# Patient Record
Sex: Male | Born: 1970 | Race: White | Hispanic: No | Marital: Married | Smoking: Former smoker
Health system: Southern US, Community
[De-identification: ages and names within clinical notes are randomized; demographics above are authoritative.]

## PROBLEM LIST (undated history)

## (undated) DIAGNOSIS — F32A Depression, unspecified: Secondary | ICD-10-CM

## (undated) DIAGNOSIS — F419 Anxiety disorder, unspecified: Secondary | ICD-10-CM

## (undated) DIAGNOSIS — N200 Calculus of kidney: Secondary | ICD-10-CM

## (undated) DIAGNOSIS — T7840XA Allergy, unspecified, initial encounter: Secondary | ICD-10-CM

## (undated) DIAGNOSIS — F329 Major depressive disorder, single episode, unspecified: Secondary | ICD-10-CM

## (undated) DIAGNOSIS — K219 Gastro-esophageal reflux disease without esophagitis: Secondary | ICD-10-CM

## (undated) DIAGNOSIS — I10 Essential (primary) hypertension: Secondary | ICD-10-CM

## (undated) HISTORY — DX: Gastro-esophageal reflux disease without esophagitis: K21.9

## (undated) HISTORY — DX: Allergy, unspecified, initial encounter: T78.40XA

## (undated) HISTORY — DX: Depression, unspecified: F32.A

## (undated) HISTORY — DX: Anxiety disorder, unspecified: F41.9

## (undated) HISTORY — DX: Essential (primary) hypertension: I10

## (undated) HISTORY — DX: Calculus of kidney: N20.0

## (undated) HISTORY — PX: KNEE ARTHROSCOPY W/ ACL RECONSTRUCTION: SHX1858

## (undated) HISTORY — PX: LITHOTRIPSY: SUR834

## (undated) HISTORY — DX: Major depressive disorder, single episode, unspecified: F32.9

## (undated) HISTORY — PX: OTHER SURGICAL HISTORY: SHX169

---

## 2000-07-03 ENCOUNTER — Emergency Department (HOSPITAL_COMMUNITY): Admission: EM | Admit: 2000-07-03 | Discharge: 2000-07-03 | Payer: Self-pay | Admitting: Emergency Medicine

## 2000-07-03 ENCOUNTER — Encounter: Payer: Self-pay | Admitting: Emergency Medicine

## 2000-08-04 ENCOUNTER — Ambulatory Visit (HOSPITAL_COMMUNITY): Admission: RE | Admit: 2000-08-04 | Discharge: 2000-08-04 | Payer: Self-pay | Admitting: Orthopedic Surgery

## 2000-08-04 ENCOUNTER — Encounter: Payer: Self-pay | Admitting: Orthopedic Surgery

## 2005-03-25 ENCOUNTER — Emergency Department (HOSPITAL_COMMUNITY): Admission: EM | Admit: 2005-03-25 | Discharge: 2005-03-25 | Payer: Self-pay | Admitting: Emergency Medicine

## 2006-11-30 ENCOUNTER — Emergency Department (HOSPITAL_COMMUNITY): Admission: EM | Admit: 2006-11-30 | Discharge: 2006-11-30 | Payer: Self-pay | Admitting: Emergency Medicine

## 2007-10-22 ENCOUNTER — Ambulatory Visit: Admission: RE | Admit: 2007-10-22 | Discharge: 2007-10-22 | Payer: Self-pay | Admitting: Pulmonary Disease

## 2008-11-25 ENCOUNTER — Emergency Department: Payer: Self-pay | Admitting: Emergency Medicine

## 2009-06-01 ENCOUNTER — Inpatient Hospital Stay (HOSPITAL_COMMUNITY): Admission: EM | Admit: 2009-06-01 | Discharge: 2009-06-04 | Payer: Self-pay | Admitting: Emergency Medicine

## 2009-06-21 ENCOUNTER — Encounter (HOSPITAL_COMMUNITY): Admission: RE | Admit: 2009-06-21 | Discharge: 2009-07-21 | Payer: Self-pay | Admitting: *Deleted

## 2010-03-28 ENCOUNTER — Ambulatory Visit (HOSPITAL_COMMUNITY): Admission: RE | Admit: 2010-03-28 | Discharge: 2010-03-28 | Payer: Self-pay | Admitting: Family Medicine

## 2010-10-26 ENCOUNTER — Ambulatory Visit (HOSPITAL_COMMUNITY)
Admission: RE | Admit: 2010-10-26 | Discharge: 2010-10-26 | Disposition: A | Discharge status: Home / Self Care | Payer: 59 | Source: Ambulatory Visit | Attending: Internal Medicine | Admitting: Internal Medicine

## 2010-10-26 ENCOUNTER — Encounter (HOSPITAL_BASED_OUTPATIENT_CLINIC_OR_DEPARTMENT_OTHER): Payer: 59 | Admitting: Internal Medicine

## 2010-10-26 DIAGNOSIS — R197 Diarrhea, unspecified: Secondary | ICD-10-CM | POA: Insufficient documentation

## 2010-10-26 DIAGNOSIS — K644 Residual hemorrhoidal skin tags: Secondary | ICD-10-CM

## 2010-10-26 DIAGNOSIS — R198 Other specified symptoms and signs involving the digestive system and abdomen: Secondary | ICD-10-CM

## 2010-10-26 DIAGNOSIS — I10 Essential (primary) hypertension: Secondary | ICD-10-CM | POA: Insufficient documentation

## 2010-10-26 DIAGNOSIS — R1032 Left lower quadrant pain: Secondary | ICD-10-CM | POA: Insufficient documentation

## 2010-10-26 DIAGNOSIS — K921 Melena: Secondary | ICD-10-CM

## 2010-10-26 DIAGNOSIS — K589 Irritable bowel syndrome without diarrhea: Secondary | ICD-10-CM

## 2010-10-26 DIAGNOSIS — Z79899 Other long term (current) drug therapy: Secondary | ICD-10-CM | POA: Insufficient documentation

## 2010-11-19 NOTE — Op Note (Signed)
  NAMECARLIE, CORPUS              ACCOUNT NO.:  [id number]  MEDICAL RECORD NO.:  [id number]           PATIENT TYPE:  O  LOCATION:  DAYP                          FACILITY:  [facility]  PHYSICIAN:  Lionel December, M.D.    DATE OF BIRTH:  1971-06-15  DATE OF PROCEDURE:  10/26/2010 DATE OF DISCHARGE:  10/26/2010                              OPERATIVE REPORT   PROCEDURE:  Colonoscopy with terminal ileoscopy.  INDICATIONS:  Fard is a 40 year old Caucasian male who has had intermittent diarrhea for the last 3 years and lately he has had hematochezia.  With his episodes of diarrhea at times, he has been constipated.  Earlier this week, he woke up with excruciating pain in his left side of his abdomen and also had diarrhea.  He finally decided to be evaluated.  He is undergoing diagnostic colonoscopy.  Procedure risks were reviewed with the patient and informed consent was obtained.  MEDICATIONS FOR CONSCIOUS SEDATION: 1. Demerol 50 mg IV. 2. Versed 4 mg IV.  FINDINGS:  Procedure performed in endoscopy suite.  The patient's vital signs and O2 sats were monitored during the procedure and remained stable.  The patient was placed in left lateral recumbent position and rectal examination performed.  No abnormality noted on external or digital exam.  Pentax videoscope was placed through rectum and advanced under vision into sigmoid colon and beyond.  Preparation was excellent. The scope was passed into cecum which was identified by ileocecal valve and appendiceal orifice.  Pictures were taken for the record.  Short segment of GI was also examined and was normal.  Colonic mucosa was carefully examined on the way out and no mucosal abnormalities or diverticular changes were noted.  Rectal mucosa similarly was normal. Scope was retroflexed to examine anorectal junction and small hemorrhoids noted below the dentate line.  Endoscope was straightened and withdrawn.  Withdrawal time was over 70  minutes.  The patient tolerated the procedure well.  FINAL DIAGNOSIS: 1. Normal terminal ileum. 2. Normal colonoscopy except small external hemorrhoids. 3. I suspect his symptoms are secondary to irritable bowel syndrome.  RECOMMENDATIONS: 1. High-fiber diet plus fiber supplement 3-4 g daily. 2. Dicyclomine 10 mg once or twice daily.  Prescription was called in     2 days ago. 3. If symptoms persist, he will call.  He will return for OV in 3     months.     Lionel December, M.D.     NR/MEDQ  D:  10/26/2010  T:  10/27/2010  Job:  [id number]  cc:   Francoise Schaumann. Raynelle Highland Fax: [phone]  Electronically Signed by Lionel December M.D. on 11/19/2010 02:20:16 PM

## 2010-12-08 LAB — CBC
MCV: 89.1 fL (ref 78.0–100.0)
RBC: 4.46 MIL/uL (ref 4.22–5.81)
WBC: 7.8 10*3/uL (ref 4.0–10.5)

## 2010-12-08 LAB — DIFFERENTIAL
Eosinophils Absolute: 0.1 10*3/uL (ref 0.0–0.7)
Lymphocytes Relative: 14 % (ref 12–46)
Lymphs Abs: 1.1 10*3/uL (ref 0.7–4.0)
Monocytes Relative: 8 % (ref 3–12)
Neutro Abs: 6 10*3/uL (ref 1.7–7.7)
Neutrophils Relative %: 77 % (ref 43–77)

## 2010-12-09 LAB — CBC
HCT: 41.2 % (ref 39.0–52.0)
HCT: 45.8 % (ref 39.0–52.0)
Hemoglobin: 15.3 g/dL (ref 13.0–17.0)
MCHC: 33.4 g/dL (ref 30.0–36.0)
MCHC: 33.8 g/dL (ref 30.0–36.0)
MCV: 89.2 fL (ref 78.0–100.0)
Platelets: 175 10*3/uL (ref 150–400)
RBC: 5.13 MIL/uL (ref 4.22–5.81)
RDW: 13.3 % (ref 11.5–15.5)

## 2010-12-09 LAB — POCT I-STAT, CHEM 8
BUN: 14 mg/dL (ref 6–23)
Creatinine, Ser: 1.3 mg/dL (ref 0.4–1.5)
Glucose, Bld: 126 mg/dL — ABNORMAL HIGH (ref 70–99)
Hemoglobin: 16.3 g/dL (ref 13.0–17.0)
TCO2: 24 mmol/L (ref 0–100)

## 2010-12-09 LAB — BASIC METABOLIC PANEL
BUN: 10 mg/dL (ref 6–23)
CO2: 26 mEq/L (ref 19–32)
Calcium: 8.5 mg/dL (ref 8.4–10.5)
GFR calc non Af Amer: 57 mL/min — ABNORMAL LOW (ref >60)
Glucose, Bld: 105 mg/dL — ABNORMAL HIGH (ref 70–99)

## 2011-01-17 NOTE — Procedures (Signed)
NAME:  Alexander Osborn, Alexander Osborn              ACCOUNT NO.:  [id number]   MEDICAL RECORD NO.:  [id number]          PATIENT TYPE:  OUT   LOCATION:  SLEE                          FACILITY:  [facility]   PHYSICIAN:  Kofi A. Gerilyn Pilgrim, M.D. DATE OF BIRTH:  1971/05/18   DATE OF PROCEDURE:  10/22/2007  DATE OF DISCHARGE:  10/22/2007                             SLEEP DISORDER REPORT   POLYSOMNOGRAPHY REPORT:   INDICATION FOR THE STUDY:  Snoring, daytime fatigue, being ruled out for  obstructive sleep apnea.  BMI 26.   EPWORTH SLEEPINESS SCALE:  11.   MEDICATIONS:  Androgen.   SLEEP SUMMARY:  The total recording time 401 minutes.  Sleep efficiency  80.2%.  Sleep latency 58.1 minutes.  REM latency 131 minutes.  Stage N1  3%, N2 68.5%, N3 18.6%, and stage REM 9.9%.   RESPIRATORY SUMMARY:  Baseline oxygen saturation 94% with lowest  desaturation 84%.  AHI index 0.2.   LIMB MOVEMENT SUMMARY:  PLM  index is 0.7.   ELECTROCARDIOGRAM SUMMARY:  Average heart rate 74.   IMPRESSION:  This is an unremarkable recording.   Thanks for this referral.      Kofi A. Gerilyn Pilgrim, M.D.  Electronically Signed     KAD/MEDQ  D:  10/28/2007  T:  10/28/2007  Job:  [id number]

## 2011-01-23 ENCOUNTER — Other Ambulatory Visit: Payer: Self-pay | Admitting: Orthopedic Surgery

## 2011-01-23 DIAGNOSIS — M25512 Pain in left shoulder: Secondary | ICD-10-CM

## 2011-01-24 ENCOUNTER — Ambulatory Visit
Admission: RE | Admit: 2011-01-24 | Discharge: 2011-01-24 | Disposition: A | Discharge status: Home / Self Care | Payer: 59 | Source: Ambulatory Visit | Attending: Orthopedic Surgery | Admitting: Orthopedic Surgery

## 2011-01-24 DIAGNOSIS — M25512 Pain in left shoulder: Secondary | ICD-10-CM

## 2011-02-06 ENCOUNTER — Ambulatory Visit (INDEPENDENT_AMBULATORY_CARE_PROVIDER_SITE_OTHER): Payer: 59 | Admitting: Internal Medicine

## 2011-08-28 ENCOUNTER — Emergency Department (HOSPITAL_COMMUNITY): Payer: BC Managed Care – PPO

## 2011-08-28 ENCOUNTER — Emergency Department (HOSPITAL_COMMUNITY)
Admission: EM | Admit: 2011-08-28 | Discharge: 2011-08-28 | Disposition: A | Discharge status: Home / Self Care | Payer: BC Managed Care – PPO | Attending: Emergency Medicine | Admitting: Emergency Medicine

## 2011-08-28 ENCOUNTER — Encounter (HOSPITAL_COMMUNITY): Payer: Self-pay | Admitting: *Deleted

## 2011-08-28 DIAGNOSIS — R109 Unspecified abdominal pain: Secondary | ICD-10-CM | POA: Insufficient documentation

## 2011-08-28 DIAGNOSIS — F17200 Nicotine dependence, unspecified, uncomplicated: Secondary | ICD-10-CM | POA: Insufficient documentation

## 2011-08-28 DIAGNOSIS — N201 Calculus of ureter: Secondary | ICD-10-CM | POA: Insufficient documentation

## 2011-08-28 DIAGNOSIS — R1032 Left lower quadrant pain: Secondary | ICD-10-CM | POA: Insufficient documentation

## 2011-08-28 LAB — CBC
Hemoglobin: 14.6 g/dL (ref 13.0–17.0)
MCH: 29.7 pg (ref 26.0–34.0)
MCHC: 33.7 g/dL (ref 30.0–36.0)
Platelets: 183 10*3/uL (ref 150–400)
RBC: 4.91 MIL/uL (ref 4.22–5.81)

## 2011-08-28 LAB — BASIC METABOLIC PANEL
BUN: 21 mg/dL (ref 6–23)
Chloride: 103 mEq/L (ref 96–112)
GFR calc Af Amer: 53 mL/min — ABNORMAL LOW (ref >90)
GFR calc non Af Amer: 45 mL/min — ABNORMAL LOW (ref >90)
Potassium: 3.7 mEq/L (ref 3.5–5.1)
Sodium: 138 mEq/L (ref 135–145)

## 2011-08-28 LAB — URINALYSIS, ROUTINE W REFLEX MICROSCOPIC
Leukocytes, UA: NEGATIVE
Nitrite: NEGATIVE
Specific Gravity, Urine: >1.03 — ABNORMAL HIGH (ref 1.005–1.030)
Urobilinogen, UA: 0.2 mg/dL (ref 0.0–1.0)
pH: 6 (ref 5.0–8.0)

## 2011-08-28 LAB — DIFFERENTIAL
Basophils Relative: 0 % (ref 0–1)
Eosinophils Absolute: 0.1 10*3/uL (ref 0.0–0.7)
Monocytes Relative: 7 % (ref 3–12)
Neutro Abs: 7.9 10*3/uL — ABNORMAL HIGH (ref 1.7–7.7)
Neutrophils Relative %: 77 % (ref 43–77)

## 2011-08-28 LAB — URINE MICROSCOPIC-ADD ON

## 2011-08-28 MED ORDER — ONDANSETRON HCL 4 MG/2ML IJ SOLN
4.0000 mg | Freq: Once | INTRAMUSCULAR | Status: AC
  Administered 2011-08-28: 4 mg via INTRAVENOUS
  Filled 2011-08-28: qty 2

## 2011-08-28 MED ORDER — KETOROLAC TROMETHAMINE 30 MG/ML IJ SOLN
INTRAMUSCULAR | Status: AC
  Filled 2011-08-28: qty 1

## 2011-08-28 MED ORDER — SODIUM CHLORIDE 0.9 % IV SOLN
Freq: Once | INTRAVENOUS | Status: AC
  Administered 2011-08-28: 08:00:00 via INTRAVENOUS

## 2011-08-28 MED ORDER — HYDROCODONE-ACETAMINOPHEN 5-325 MG PO TABS: 1.0000 | ORAL_TABLET | Freq: Four times a day (QID) | ORAL | Status: AC | PRN

## 2011-08-28 MED ORDER — TAMSULOSIN HCL 0.4 MG PO CAPS: 0.4000 mg | ORAL_CAPSULE | Freq: Every day | ORAL | Status: DC

## 2011-08-28 MED ORDER — HYDROMORPHONE HCL PF 1 MG/ML IJ SOLN
1.0000 mg | Freq: Once | INTRAMUSCULAR | Status: AC
  Administered 2011-08-28: 1 mg via INTRAVENOUS
  Filled 2011-08-28: qty 1

## 2011-08-28 MED ORDER — PROMETHAZINE HCL 25 MG PO TABS: 25.0000 mg | ORAL_TABLET | Freq: Four times a day (QID) | ORAL | Status: AC | PRN

## 2011-08-28 MED ORDER — KETOROLAC TROMETHAMINE 30 MG/ML IJ SOLN
30.0000 mg | Freq: Once | INTRAMUSCULAR | Status: AC
  Administered 2011-08-28: 30 mg via INTRAVENOUS

## 2011-08-28 MED ORDER — NAPROXEN 500 MG PO TABS: 500.0000 mg | ORAL_TABLET | Freq: Two times a day (BID) | ORAL | Status: AC

## 2011-08-28 NOTE — ED Notes (Signed)
Pt c/o left flank pain since 3 am. Pt also c/o nausea. Pt states that he had the pain night before last and then it went away until 3 am. Denies fever, vomiting or diarrhea.

## 2011-08-28 NOTE — ED Provider Notes (Signed)
History   This chart was scribed for Alexander Jakes, MD by [name]. The patient was seen in room [facility] and the patient's care was started at 8:08AM.   CSN: [id number]  Arrival date & time 08/28/11  0650   First MD Initiated Contact with Patient 08/28/11 [phone]      Chief Complaint  Patient presents with  . Flank Pain    (Consider location/radiation/quality/duration/timing/severity/associated sxs/prior treatment) HPI BENJAMEN Osborn is a 40 y.o. male who presents to the Emergency Department complaining of waxing and waning moderate to severe left flank and LLQ abdominal pain onset yesterday morning and persistent since with associated nausea and back pain. Patient states left flank pain became significantly worse 5 hours ago. Denies dysuria, dark urine, vomiting, diarrhea, HA, neck pain, swelling of lower extremities, blurred vision, chest pain, SOB. Denies h/o kidney stones.  History reviewed. No pertinent past medical history.  Past Surgical History  Procedure Date  . Knee arthroscopy w/ acl reconstruction     right  . Thumb surgery     History reviewed. No pertinent family history.  History  Substance Use Topics  . Smoking status: Current Everyday Smoker    Types: Cigarettes  . Smokeless tobacco: Not on file  . Alcohol Use: Yes     socially      Review of Systems  Constitutional: Negative for fever and chills.  HENT: Negative for rhinorrhea and neck pain.   Eyes: Negative for pain.  Respiratory: Negative for cough and shortness of breath.   Cardiovascular: Negative for chest pain.  Gastrointestinal: Positive for nausea and abdominal pain. Negative for vomiting and diarrhea.  Genitourinary: Positive for flank pain. Negative for dysuria.  Musculoskeletal: Positive for back pain.  Skin: Negative for rash.  Neurological: Negative for dizziness and weakness.    Allergies  Ampicillin  Home Medications   Current Outpatient Rx  Name Route Sig  Dispense Refill  . IBUPROFEN 200 MG PO TABS Oral Take 200 mg by mouth every 6 (six) hours as needed. pain     . HYDROCODONE-ACETAMINOPHEN 5-325 MG PO TABS Oral Take 1-2 tablets by mouth every 6 (six) hours as needed for pain. 10 tablet 0  . NAPROXEN 500 MG PO TABS Oral Take 1 tablet (500 mg total) by mouth 2 (two) times daily. 14 tablet 0  . PROMETHAZINE HCL 25 MG PO TABS Oral Take 1 tablet (25 mg total) by mouth every 6 (six) hours as needed for nausea. 12 tablet 0    BP 136/85  Pulse 76  Temp(Src) 97.6 F (36.4 C) (Oral)  Resp 20  Ht 5\' 11"  (1.803 m)  Wt 225 lb (102.059 kg)  BMI 31.38 kg/m2  SpO2 97%  Physical Exam  Nursing note and vitals reviewed. Constitutional: He is oriented to person, place, and time. He appears well-developed and well-nourished. No distress.  HENT:  Head: Normocephalic and atraumatic.       Mucous membranes moist.   Eyes: EOM are normal. Pupils are equal, round, and reactive to light.  Neck: Neck supple. No tracheal deviation present.  Cardiovascular: Normal rate, regular rhythm and normal heart sounds.  Exam reveals no gallop and no friction rub.   No murmur heard. Pulmonary/Chest: Effort normal and breath sounds normal. No respiratory distress. He has no wheezes. He exhibits no tenderness.  Abdominal: Soft. Bowel sounds are normal. He exhibits no distension. There is no tenderness.  Musculoskeletal: Normal range of motion. He exhibits no edema.  Neurological: He is alert  and oriented to person, place, and time. No cranial nerve deficit or sensory deficit.  Skin: Skin is warm and dry.  Psychiatric: He has a normal mood and affect. His behavior is normal.    ED Course  Procedures (including critical care time)  DIAGNOSTIC STUDIES: Oxygen Saturation is 97% on room air, normal by my interpretation.    COORDINATION OF CARE:    Labs Reviewed  DIFFERENTIAL - Abnormal; Notable for the following:    Neutro Abs 7.9 (*)    All other components within  normal limits  BASIC METABOLIC PANEL - Abnormal; Notable for the following:    Glucose, Bld 116 (*)    Creatinine, Ser 1.80 (*)    GFR calc non Af Amer 45 (*)    GFR calc Af Amer 53 (*)    All other components within normal limits  URINALYSIS, ROUTINE W REFLEX MICROSCOPIC - Abnormal; Notable for the following:    Specific Gravity, Urine >1.030 (*)    Hgb urine dipstick MODERATE (*)    All other components within normal limits  CBC  URINE MICROSCOPIC-ADD ON   Ct Abdomen Pelvis Wo Contrast  08/28/2011  *RADIOLOGY REPORT*  Clinical Data: Flank pain  CT ABDOMEN AND PELVIS WITHOUT CONTRAST  Technique:  Multidetector CT imaging of the abdomen and pelvis was performed following the standard protocol without intravenous contrast.  Comparison: 06/01/2009  Findings:  The lung bases are clear.  No pleural effusion or pulmonary edema identified.  Mild diffuse fatty infiltration of the liver.  The gallbladder appears normal.  Spleen is normal.  Both adrenal glands appear within normal limits.  The pancreas is normal.  Normal appearance of the right kidney.  Left hydronephrosis and hydroureter.  Within the distal half of the ureter there is a 7.1 mm calculus, image 75.  Urinary bladder appears normal.  No free fluid or fluid collections within the abdomen or pelvis.  The stomach and the small bowel loops are negative.  The appendix is identified and appears normal.  The colon is negative.  Review of the visualized osseous structures is unremarkable.  IMPRESSION:  1.  Distal left ureteral stone measures 7.1 mm.  There is a moderate left hydronephrosis and hydroureter identified.  Original Report Authenticated By: Rosealee Albee, M.D.   Results for orders placed during the hospital encounter of 08/28/11  CBC      Component Value Range   WBC 10.3  4.0 - 10.5 (K/uL)   RBC 4.91  4.22 - 5.81 (MIL/uL)   Hemoglobin 14.6  13.0 - 17.0 (g/dL)   HCT 16.1  09.6 - 04.5 (%)   MCV 88.2  78.0 - 100.0 (fL)   MCH 29.7   26.0 - 34.0 (pg)   MCHC 33.7  30.0 - 36.0 (g/dL)   RDW 40.9  81.1 - 91.4 (%)   Platelets 183  150 - 400 (K/uL)  DIFFERENTIAL      Component Value Range   Neutrophils Relative 77  43 - 77 (%)   Neutro Abs 7.9 (*) 1.7 - 7.7 (K/uL)   Lymphocytes Relative 16  12 - 46 (%)   Lymphs Abs 1.6  0.7 - 4.0 (K/uL)   Monocytes Relative 7  3 - 12 (%)   Monocytes Absolute 0.8  0.1 - 1.0 (K/uL)   Eosinophils Relative 1  0 - 5 (%)   Eosinophils Absolute 0.1  0.0 - 0.7 (K/uL)   Basophils Relative 0  0 - 1 (%)   Basophils Absolute 0.0  0.0 - 0.1 (K/uL)  BASIC METABOLIC PANEL      Component Value Range   Sodium 138  135 - 145 (mEq/L)   Potassium 3.7  3.5 - 5.1 (mEq/L)   Chloride 103  96 - 112 (mEq/L)   CO2 26  19 - 32 (mEq/L)   Glucose, Bld 116 (*) 70 - 99 (mg/dL)   BUN 21  6 - 23 (mg/dL)   Creatinine, Ser 1.61 (*) 0.50 - 1.35 (mg/dL)   Calcium 9.4  8.4 - 09.6 (mg/dL)   GFR calc non Af Amer 45 (*) >90 (mL/min)   GFR calc Af Amer 53 (*) >90 (mL/min)  URINALYSIS, ROUTINE W REFLEX MICROSCOPIC      Component Value Range   Color, Urine YELLOW  YELLOW    APPearance CLEAR  CLEAR    Specific Gravity, Urine >1.030 (*) 1.005 - 1.030    pH 6.0  5.0 - 8.0    Glucose, UA NEGATIVE  NEGATIVE (mg/dL)   Hgb urine dipstick MODERATE (*) NEGATIVE    Bilirubin Urine NEGATIVE  NEGATIVE    Ketones, ur NEGATIVE  NEGATIVE (mg/dL)   Protein, ur NEGATIVE  NEGATIVE (mg/dL)   Urobilinogen, UA 0.2  0.0 - 1.0 (mg/dL)   Nitrite NEGATIVE  NEGATIVE    Leukocytes, UA NEGATIVE  NEGATIVE   URINE MICROSCOPIC-ADD ON      Component Value Range   Squamous Epithelial / LPF RARE  RARE    RBC / HPF 3-6  <3 (RBC/hpf)     1. Ureteral calculus, left       MDM   Findings today reveal a large left ureteral kidney stone measuring 7.1 mm it may be difficult to pass on his own. Elevation in creatinine at 1.8. Patient will need urology followup and followup with primary care provider for urology followup as delayed. Need to be seen  if not able to pass the stone in the next 2 days.    I personally performed the services described in this documentation, which was scribed in my presence. The recorded information has been reviewed and considered.     Alexander Jakes, MD 08/28/11 [phone]

## 2011-10-11 ENCOUNTER — Other Ambulatory Visit (HOSPITAL_COMMUNITY): Payer: Self-pay | Admitting: Urology

## 2011-10-11 DIAGNOSIS — N201 Calculus of ureter: Secondary | ICD-10-CM

## 2011-10-25 ENCOUNTER — Ambulatory Visit (HOSPITAL_COMMUNITY): Payer: BC Managed Care – PPO

## 2012-02-20 ENCOUNTER — Other Ambulatory Visit (HOSPITAL_COMMUNITY): Payer: Self-pay | Admitting: Pediatrics

## 2012-02-20 DIAGNOSIS — R109 Unspecified abdominal pain: Secondary | ICD-10-CM

## 2012-02-21 ENCOUNTER — Ambulatory Visit (HOSPITAL_COMMUNITY)
Admission: RE | Admit: 2012-02-21 | Discharge: 2012-02-21 | Disposition: A | Discharge status: Home / Self Care | Payer: BC Managed Care – PPO | Source: Ambulatory Visit | Attending: Pediatrics | Admitting: Pediatrics

## 2012-02-21 DIAGNOSIS — R109 Unspecified abdominal pain: Secondary | ICD-10-CM | POA: Insufficient documentation

## 2012-02-22 ENCOUNTER — Other Ambulatory Visit (HOSPITAL_COMMUNITY): Payer: BC Managed Care – PPO

## 2012-06-27 ENCOUNTER — Ambulatory Visit (INDEPENDENT_AMBULATORY_CARE_PROVIDER_SITE_OTHER): Payer: BC Managed Care – PPO | Admitting: Otolaryngology

## 2017-02-26 ENCOUNTER — Ambulatory Visit (INDEPENDENT_AMBULATORY_CARE_PROVIDER_SITE_OTHER): Payer: BLUE CROSS/BLUE SHIELD | Admitting: Family Medicine

## 2017-02-26 ENCOUNTER — Encounter: Payer: Self-pay | Admitting: Family Medicine

## 2017-02-26 DIAGNOSIS — E663 Overweight: Secondary | ICD-10-CM

## 2017-02-26 DIAGNOSIS — I10 Essential (primary) hypertension: Secondary | ICD-10-CM

## 2017-02-26 DIAGNOSIS — F39 Unspecified mood [affective] disorder: Secondary | ICD-10-CM | POA: Insufficient documentation

## 2017-02-26 DIAGNOSIS — N200 Calculus of kidney: Secondary | ICD-10-CM | POA: Diagnosis not present

## 2017-02-26 HISTORY — DX: Calculus of kidney: N20.0

## 2017-02-26 MED ORDER — ESCITALOPRAM OXALATE 10 MG PO TABS: 10.0000 mg | ORAL_TABLET | Freq: Every day | ORAL | 3 refills | Status: DC

## 2017-02-26 NOTE — Patient Instructions (Signed)
Need records Dr Juanetta GoslingHawkins  Need blood work from work  Take the BP medicine and the [employer] Internationallex[employer]ro daily  See me yearly for check up  Call  sooner for problems

## 2017-02-26 NOTE — Progress Notes (Signed)
Chief Complaint  Patient presents with  . Hypertension   New patient Patient works in Patent examiner for [employer]. He indicates that his job is stressful. Because of stress on his job and irritability he was started on Lexapro 10 mg a day. This has worked well for him. He needs a refill today. He also has hypertension. He supposed to take amlodipine 2.5 mg daily. He does not remember to take it every day. He did not take today. His blood pressure is elevated on 2 readings. He is advised that he needs to take his blood pressure medicine daily. He states that he had blood work at work for a "wellness screening". Included a sugar and cholesterol. He will bring that back to me His tetanus is up-to-date. He gets yearly flu shots. He has no acute health concerns at this point. He does have a history of kidney stones, none for about 5 years. We discussed that he is overweight. Discussed diet, portions snacking and carbohydrates. Discussed regular exercise. 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 Swimming, and stationary bicycle are recommended.   Patient Active Problem List   Diagnosis Date Noted  . Essential hypertension 02/26/2017  . Kidney stones 02/26/2017  . Overweight 02/26/2017  . Mood disorder (HCC) 02/26/2017    Outpatient Encounter Prescriptions as of 02/26/2017  Medication Sig  . amLODipine (NORVASC) 2.5 MG tablet Take 2.5 mg by mouth daily.  Marland Kitchen escitalopram (LEXAPRO) 10 MG tablet Take 1 tablet (10 mg total) by mouth daily.  Marland Kitchen ibuprofen (ADVIL,MOTRIN) 200 MG tablet Take 200 mg by mouth every 6 (six) hours as needed. pain    No facility-administered encounter medications on file as of 02/26/2017.     Past Medical History:  Diagnosis Date  . Allergy    ampicillin  . Anxiety   . Depression   . GERD (gastroesophageal reflux disease)   . Hypertension   . Kidney stones 02/26/2017    Past Surgical History:  Procedure Laterality  Date  . KNEE ARTHROSCOPY W/ ACL RECONSTRUCTION     right  . LITHOTRIPSY    . thumb surgery      Social History   Social History  . Marital status: Married    Spouse name: Idalia Needle  . Number of children: 2  . Years of education: 14   Occupational History  . Detective, Economist, [employer]development worker [employer]   Social History Main Topics  . Smoking status: Former Smoker    Types: Cigarettes    Start date: 09/04/1992    Quit date: 09/04/2000  . Smokeless tobacco: Current User    Types: Snuff  . Alcohol use Yes     Comment: socially  . Drug use: No  . Sexual activity: Yes    Birth control/ protection: None   Other Topics Concern  . Not on file   Social History Narrative   Married   Two daughters   Patent examiner    Family History  Problem Relation Age of Onset  . Stroke Mother   . Heart disease Maternal Grandmother   . Alzheimer's disease Maternal Grandfather   . Alcohol abuse Paternal Grandfather   . COPD Paternal Grandfather     Review of Systems  Constitutional: Negative for chills, fever and weight loss.       Weight gain  HENT: Negative for congestion and hearing loss.   Eyes: Negative for blurred vision and pain.  Respiratory:  Negative for cough and shortness of breath.   Cardiovascular: Negative for chest pain and leg swelling.  Gastrointestinal: Negative for abdominal pain, constipation, diarrhea and heartburn.  Genitourinary: Negative for dysuria and frequency.  Musculoskeletal: Positive for joint pain. Negative for falls and myalgias.  Neurological: Negative for dizziness, seizures and headaches.  Psychiatric/Behavioral: Negative for depression. The patient is not nervous/anxious and does not have insomnia.     BP (!) 144/98   Pulse 76   Temp 97.8 F (36.6 C) (Temporal)   Resp 16   Ht 5' 10.5" (1.791 m)   Wt 249 lb (112.9 kg)   SpO2 96%   BMI 35.22 kg/m   Physical Exam  Constitutional: He is oriented to person, place, and time.  He appears well-developed and well-nourished.  HENT:  Head: Normocephalic and atraumatic.  Mouth/Throat: Oropharynx is clear and moist.  Eyes: Conjunctivae are normal. Pupils are equal, round, and reactive to light.  Neck: Normal range of motion. Neck supple. No thyromegaly present.  Cardiovascular: Normal rate, regular rhythm and normal heart sounds.   Pulmonary/Chest: Effort normal and breath sounds normal. No respiratory distress.  Abdominal: Soft. Bowel sounds are normal.  Musculoskeletal: Normal range of motion. He exhibits no edema.  Lymphadenopathy:    He has no cervical adenopathy.  Neurological: He is alert and oriented to person, place, and time.  Gait normal  Skin: Skin is warm and dry.  Psychiatric: He has a normal mood and affect. His behavior is normal. Thought content normal.  Nursing note and vitals reviewed.  ASSESSMENT/PLAN:  1. Essential hypertension Not well controlled. Not compliant with medication.  2. Kidney stones History of  3. Overweight Discussed importance of diet and exercise.  4. Mood disorder (HCC) Refill Lexapro.   Patient Instructions  Need records Dr Juanetta GoslingHawkins  Need blood work from work  Take the BP medicine and the lexapro daily  See me yearly for check up  Call  sooner for problems     Alexander MooreYvonne Sue Leotha Westermeyer, MD

## 2017-03-08 ENCOUNTER — Ambulatory Visit: Payer: Self-pay | Admitting: Family Medicine

## 2017-11-21 ENCOUNTER — Ambulatory Visit (INDEPENDENT_AMBULATORY_CARE_PROVIDER_SITE_OTHER): Payer: Commercial Managed Care - PPO | Admitting: Family Medicine

## 2017-11-21 ENCOUNTER — Other Ambulatory Visit: Payer: Self-pay

## 2017-11-21 ENCOUNTER — Encounter: Payer: Self-pay | Admitting: Family Medicine

## 2017-11-21 VITALS — BP 136/86 | HR 72 | Temp 97.6°F | Resp 16 | Ht 71.0 in | Wt 245.0 lb

## 2017-11-21 DIAGNOSIS — R059 Cough, unspecified: Secondary | ICD-10-CM

## 2017-11-21 DIAGNOSIS — R05 Cough: Secondary | ICD-10-CM

## 2017-11-21 MED ORDER — BENZONATATE 200 MG PO CAPS: 200.0000 mg | ORAL_CAPSULE | Freq: Three times a day (TID) | ORAL | 1 refills | Status: DC | PRN

## 2017-11-21 MED ORDER — AZITHROMYCIN 250 MG PO TABS: ORAL_TABLET | ORAL | 0 refills | Status: DC

## 2017-11-21 NOTE — Progress Notes (Signed)
Chief Complaint  Patient presents with  . Cough  . Sinus Problem   Patient is here for upper respiratory symptoms and cough.  They have been present for 10-14 days.  He caught a cold from his daughter.  Initially it was sore throat and runny nose and sinus congestion, he had some postnasal drip, he then started coughing.  Most of his symptoms are improving except for the cough.  He has a harsh cough that is present day and night.  It is keeping him awake at night.  No fever or chills.  No sputum production.  No wheezing.  No shortness of breath.  No chest pain.  He does have some fatigue but he thinks that sleep deprivation.  No malaise or body aches. He states in general he does not sleep well.  His wife observes that he snores.  We discussed potential obstructive sleep apnea.  He states that he wants to try to lose weight and see if this improves his situation.  He really is resistant to sleep study and the thought of wearing a CPAP at night.  I explained that if he has untreated sleep apnea could affect his health and his heart.  Patient Active Problem List   Diagnosis Date Noted  . Essential hypertension 02/26/2017  . Kidney stones 02/26/2017  . Overweight 02/26/2017  . Mood disorder (HCC) 02/26/2017    Outpatient Encounter Medications as of 11/21/2017  Medication Sig  . escitalopram (LEXAPRO) 10 MG tablet Take 1 tablet (10 mg total) by mouth daily.  Marland Kitchen. amLODipine (NORVASC) 2.5 MG tablet Take 2.5 mg by mouth daily.  Marland Kitchen. azithromycin (ZITHROMAX) 250 MG tablet tad  . benzonatate (TESSALON) 200 MG capsule Take 1 capsule (200 mg total) by mouth 3 (three) times daily as needed for cough.  Marland Kitchen. ibuprofen (ADVIL,MOTRIN) 200 MG tablet Take 200 mg by mouth every 6 (six) hours as needed. pain    No facility-administered encounter medications on file as of 11/21/2017.     Allergies  Allergen Reactions  . Ampicillin     Review of Systems  Constitutional: Negative for activity change, appetite  change, chills, fatigue and fever.  HENT: Positive for congestion and rhinorrhea. Negative for sinus pressure, sinus pain and sore throat.   Eyes: Negative for redness and visual disturbance.  Respiratory: Positive for cough. Negative for shortness of breath and wheezing.   Cardiovascular: Negative for chest pain and palpitations.  Gastrointestinal: Negative for diarrhea, nausea and vomiting.  Genitourinary: Negative for dysuria and frequency.  Musculoskeletal: Negative for back pain and myalgias.  Neurological: Positive for headaches. Negative for dizziness.       Has headache, thinks it is because he is trying to stop caffeine  Psychiatric/Behavioral: Positive for sleep disturbance. Negative for agitation.    BP 136/86 (BP Location: Left Arm, Patient Position: Sitting, Cuff Size: Normal)   Pulse 72   Temp 97.6 F (36.4 C) (Temporal)   Resp 16   Ht 5\' 11"  (1.803 m)   Wt 245 lb (111.1 kg)   SpO2 96%   BMI 34.17 kg/m   Physical Exam  Constitutional: He is oriented to person, place, and time. He appears well-developed and well-nourished.  HENT:  Head: Normocephalic and atraumatic.  Right Ear: External ear normal.  Left Ear: External ear normal.  Mouth/Throat: Oropharynx is clear and moist.  No sinus tenderness, clear rhinorrhea  Eyes: Conjunctivae are normal. Pupils are equal, round, and reactive to light.  Neck: Normal range of motion.  Neck supple. No thyromegaly present.  Cardiovascular: Normal rate, regular rhythm and normal heart sounds.  Pulmonary/Chest: Effort normal and breath sounds normal. No respiratory distress.  Harsh cough, lungs are clear  Abdominal: Soft. Bowel sounds are normal.  Obese abdomen  Musculoskeletal: Normal range of motion. He exhibits no edema.  Lymphadenopathy:    He has no cervical adenopathy.  Neurological: He is alert and oriented to person, place, and time.  Gait normal  Skin: Skin is warm and dry.  Psychiatric: He has a normal mood and  affect. His behavior is normal. Thought content normal.  Nursing note and vitals reviewed.   ASSESSMENT/PLAN:  1. Cough in adult patient Discussed likely viral in onset.  After 10 days, a course of antibiotics is not unreasonable but there is no guarantee it will help.  Discussed cough management with honey, Tessalon, dextromethorphan.  Humidifier may help.  Pushing fluids will help.  Expect improvement over 7-10 days.  If not improving call for follow-up   Patient Instructions  Take the tessalon for cough Take delsym in addition if needed  Push fluids Take the z pak  Call if not better in a few days   Eustace Moore, MD

## 2017-11-21 NOTE — Patient Instructions (Signed)
Take the tessalon for cough Take delsym in addition if needed  Push fluids Take the z pak  Call if not better in a few days

## 2018-02-08 ENCOUNTER — Encounter: Payer: Self-pay | Admitting: Family Medicine

## 2018-02-26 ENCOUNTER — Ambulatory Visit: Payer: BLUE CROSS/BLUE SHIELD | Admitting: Family Medicine

## 2018-04-16 ENCOUNTER — Other Ambulatory Visit (HOSPITAL_BASED_OUTPATIENT_CLINIC_OR_DEPARTMENT_OTHER): Payer: Self-pay

## 2018-04-16 ENCOUNTER — Ambulatory Visit: Payer: Commercial Managed Care - PPO | Attending: Internal Medicine | Admitting: Neurology

## 2018-04-16 DIAGNOSIS — G4733 Obstructive sleep apnea (adult) (pediatric): Secondary | ICD-10-CM | POA: Insufficient documentation

## 2018-04-16 DIAGNOSIS — R0683 Snoring: Secondary | ICD-10-CM | POA: Diagnosis present

## 2018-04-16 DIAGNOSIS — Z79899 Other long term (current) drug therapy: Secondary | ICD-10-CM | POA: Insufficient documentation

## 2018-04-24 NOTE — Procedures (Signed)
  [facility] Lithzy Bernard A. Gerilyn Pilgrimoonquah, MD     [email or web address]             HOME SLEEP STUDY  LOCATION: [facility]  Patient Name: Ninfa MeekerCheek, Johnathan Study Date: 04/16/2018 Gender: Male D.O.B: 1970-09-25 Age (years): 47 Referring Provider: Catalina PizzaZach Hall Height (inches): 71 Interpreting Physician: Beryle BeamsKofi Earnestine Shipp MD, ABSM Weight (lbs): 245 RPSGT: Alfonso EllisHedrick, Debra BMI: 34 MRN: [id number] Neck Size: CLINICAL INFORMATION Sleep Study Type: HST     Indication for sleep study: Snoring     Epworth Sleepiness Score: NA  SLEEP STUDY TECHNIQUE A multi-channel overnight portable sleep study was performed. The channels recorded were: nasal airflow, thoracic respiratory movement, and oxygen saturation with a pulse oximetry. Snoring was also monitored.  MEDICATIONS Patient self administered medications include: N/A.  Current Outpatient Medications:  .  amLODipine (NORVASC) 2.5 MG tablet, Take 2.5 mg by mouth daily., Disp: , Rfl:  .  azithromycin (ZITHROMAX) 250 MG tablet, tad, Disp: 6 tablet, Rfl: 0 .  benzonatate (TESSALON) 200 MG capsule, Take 1 capsule (200 mg total) by mouth 3 (three) times daily as needed for cough., Disp: 20 capsule, Rfl: 1 .  escitalopram (LEXAPRO) 10 MG tablet, Take 1 tablet (10 mg total) by mouth daily., Disp: 90 tablet, Rfl: 3 .  ibuprofen (ADVIL,MOTRIN) 200 MG tablet, Take 200 mg by mouth every 6 (six) hours as needed. pain , Disp: , Rfl:    SLEEP ARCHITECTURE Patient was studied for 476.9 minutes. The sleep efficiency was 99.4 % and the patient was supine for 89.5%. The arousal index was 0.0 per hour.  RESPIRATORY PARAMETERS The overall AHI was 22.0 per hour, with a central apnea index of 4.2 per hour.  The oxygen nadir was 72% during sleep.     CARDIAC DATA Mean heart rate during sleep was 73.1 bpm.  IMPRESSIONS Moderate obstructive sleep apnea occurred during this study (AHI = 22.0/h).  A formal CPAP titration study is recommended.   Argie RammingKofi A  Knight Oelkers, MD Diplomate, American Board of Sleep Medicine.   ELECTRONICALLY SIGNED ON:  04/24/2018, 2:17 PM [facility] PH: [phone]   FX: [phone] ACCREDITED BY THE AMERICAN ACADEMY OF SLEEP MEDICINE

## 2018-06-17 ENCOUNTER — Ambulatory Visit: Payer: Commercial Managed Care - PPO | Attending: Internal Medicine | Admitting: Neurology

## 2018-06-17 DIAGNOSIS — R0683 Snoring: Secondary | ICD-10-CM | POA: Diagnosis present

## 2018-06-17 DIAGNOSIS — G4730 Sleep apnea, unspecified: Secondary | ICD-10-CM | POA: Insufficient documentation

## 2018-06-17 DIAGNOSIS — G4733 Obstructive sleep apnea (adult) (pediatric): Secondary | ICD-10-CM

## 2018-06-19 NOTE — Procedures (Signed)
  [facility] Alexander Staszak A. Gerilyn Pilgrim, MD     [email or web address]             NOCTURNAL POLYSOMNOGRAPHY   LOCATION: [facility]   Patient Name: Alexander Osborn, Alexander Osborn: 06/17/2018 Gender: Male D.O.B: May 17, 1971 Age (years): 47 Referring Provider: Catalina Pizza Height (inches): 71 Interpreting Physician: Beryle Beams MD, ABSM Weight (lbs): 245 RPSGT: Alfonso Ellis BMI: 34 MRN: [id number] Neck Size: 18.00 CLINICAL INFORMATION The patient is referred for a CPAP titration to treat sleep apnea.  Osborn of NPSG, Split Night or HST:  SLEEP STUDY TECHNIQUE As per the AASM Manual for the Scoring of Sleep and Associated Events v2.3 (April 2016) with a hypopnea requiring 4% desaturations.  The channels recorded and monitored were frontal, central and occipital EEG, electrooculogram (EOG), submentalis EMG (chin), nasal and oral airflow, thoracic and abdominal wall motion, anterior tibialis EMG, snore microphone, electrocardiogram, and pulse oximetry. Continuous positive airway pressure (CPAP) was initiated at the beginning of the study and titrated to treat sleep-disordered breathing.  MEDICATIONS Medications self-administered by patient taken the night of the study : N/A  Current Outpatient Medications:  .  amLODipine (NORVASC) 2.5 MG tablet, Take 2.5 mg by mouth daily., Disp: , Rfl:  .  azithromycin (ZITHROMAX) 250 MG tablet, tad, Disp: 6 tablet, Rfl: 0 .  benzonatate (TESSALON) 200 MG capsule, Take 1 capsule (200 mg total) by mouth 3 (three) times daily as needed for cough., Disp: 20 capsule, Rfl: 1 .  escitalopram (LEXAPRO) 10 MG tablet, Take 1 tablet (10 mg total) by mouth daily., Disp: 90 tablet, Rfl: 3 .  ibuprofen (ADVIL,MOTRIN) 200 MG tablet, Take 200 mg by mouth every 6 (six) hours as needed. pain , Disp: , Rfl:    TECHNICIAN COMMENTS Comments added by technician: Patient tolerated CPAP therapy very well. Therapy started at 4 cm of H20 and increased to 10 cm of H20  with good control of snoring and events Comments added by scorer: N/A RESPIRATORY PARAMETERS Optimal PAP Pressure (cm): 9 AHI at Optimal Pressure (/hr): 1.0 Overall Minimal O2 (%): 89.0 Supine % at Optimal Pressure (%): 0 Minimal O2 at Optimal Pressure (%): 91.0   SLEEP ARCHITECTURE The study was initiated at 11:04:17 PM and ended at 5:37:02 AM.  Sleep onset time was 28.7 minutes and the sleep efficiency was 88.0%%. The total sleep time was 345.5 minutes.  The patient spent 2.5%% of the night in stage N1 sleep, 39.4%% in stage N2 sleep, 28.7%% in stage N3 and 29.5% in REM.Stage REM latency was 47.5 minutes  Wake after sleep onset was 18.5. Alpha intrusion was absent. Supine sleep was 0.00%.  CARDIAC DATA The 2 lead EKG demonstrated sinus rhythm. The mean heart rate was 65.3 beats per minute. Other EKG findings include: None.  LEG MOVEMENT DATA The total Periodic Limb Movements of Sleep (PLMS) were 0. The PLMS index was 0.0. A PLMS index of <15 is considered normal in adults.  IMPRESSIONS The optimal CPAP is 9 cm of water.  Argie Ramming, MD Diplomate, American Board of Sleep Medicine.  ELECTRONICALLY SIGNED ON:  06/19/2018, 10:12 AM Alexander Osborn PH: [phone]   FX: [phone] ACCREDITED BY THE AMERICAN ACADEMY OF SLEEP MEDICINE

## 2019-02-26 ENCOUNTER — Encounter (HOSPITAL_COMMUNITY): Payer: Self-pay

## 2019-02-26 ENCOUNTER — Ambulatory Visit (HOSPITAL_COMMUNITY): Payer: Commercial Managed Care - PPO

## 2019-03-14 ENCOUNTER — Other Ambulatory Visit: Payer: Self-pay | Admitting: Orthopedic Surgery

## 2019-05-20 ENCOUNTER — Encounter (HOSPITAL_COMMUNITY): Payer: Self-pay

## 2019-05-20 ENCOUNTER — Ambulatory Visit (HOSPITAL_COMMUNITY): Payer: Commercial Managed Care - PPO | Admitting: Physical Therapy

## 2019-05-23 ENCOUNTER — Telehealth (HOSPITAL_COMMUNITY): Payer: Self-pay | Admitting: *Deleted

## 2019-05-23 ENCOUNTER — Ambulatory Visit (HOSPITAL_COMMUNITY): Payer: Commercial Managed Care - PPO | Attending: Orthopedic Surgery | Admitting: Physical Therapy

## 2019-05-23 ENCOUNTER — Encounter (HOSPITAL_COMMUNITY): Payer: Self-pay | Admitting: Physical Therapy

## 2019-05-23 ENCOUNTER — Other Ambulatory Visit: Payer: Self-pay

## 2019-05-23 DIAGNOSIS — R29898 Other symptoms and signs involving the musculoskeletal system: Secondary | ICD-10-CM | POA: Insufficient documentation

## 2019-05-23 DIAGNOSIS — M542 Cervicalgia: Secondary | ICD-10-CM | POA: Diagnosis present

## 2019-05-23 DIAGNOSIS — M6281 Muscle weakness (generalized): Secondary | ICD-10-CM | POA: Insufficient documentation
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
## 2019-05-23 NOTE — Telephone Encounter (Signed)
pt called to reschedule he forgot that he has a drs appt on 9/22 at the same time therapy was scheduled  05/23/19

## 2019-05-23 NOTE — Patient Instructions (Signed)
Cervical AROM: Flexion/extension, LF, rotation 10x twice daily

## 2019-05-27 ENCOUNTER — Encounter (HOSPITAL_COMMUNITY): Payer: Commercial Managed Care - PPO | Admitting: Physical Therapy

## 2019-05-28 ENCOUNTER — Ambulatory Visit (HOSPITAL_COMMUNITY): Payer: Commercial Managed Care - PPO | Admitting: Physical Therapy

## 2019-05-28 ENCOUNTER — Other Ambulatory Visit: Payer: Self-pay

## 2019-05-28 DIAGNOSIS — M542 Cervicalgia: Secondary | ICD-10-CM | POA: Diagnosis not present

## 2019-05-28 DIAGNOSIS — M6281 Muscle weakness (generalized): Secondary | ICD-10-CM

## 2019-05-28 NOTE — Patient Instructions (Addendum)
Scapular Retraction (Standing)   Pull shoulder blades back and in  With arms at sides, pinch shoulder blades together. Repeat _10___ times per set. Do ___1_ sets per session. Do ___3_ sessions per day.  http://orth.exer.us/944   Copyright  VHI. All rights reserved.  Flexibility: Neck Retraction    Pull head straight back, keeping eyes and jaw level. Repeat __10__ times per set. Do _1__ sets per session. Do __3 sessions per day.  http://orth.exer.us/344   Copyright  VHI. All rights reserved.

## 2019-05-28 NOTE — Therapy (Signed)
[place], [place], [address] Phone: [phone]   Fax:  [phone]  Physical Therapy Treatment  Patient Details  Name: Alexander Osborn MRN: [id number] Date of Birth: [birth year] Referring Provider (PT): Phylliss Bob, MD   Encounter Date: 05/28/2019  PT End of Session - 05/28/19 0917    Visit Number  2    Number of Visits  8    Date for PT Re-Evaluation  06/20/19    Authorization Type  [employer] PPO (30 visit limit PT/OT/Manipulations compbined)    Authorization Time Period  05/23/19-06/20/19    Authorization - Visit Number  2    Authorization - Number of Visits  30    PT Start Time  0828    PT Stop Time  0912    PT Time Calculation (min)  44 min    Activity Tolerance  Patient tolerated treatment well    Behavior During Therapy  [facility] for tasks assessed/performed       Past Medical History:  Diagnosis Date  . Allergy    ampicillin  . Anxiety   . Depression   . GERD (gastroesophageal reflux disease)   . Hypertension   . Kidney stones 02/26/2017    Past Surgical History:  Procedure Laterality Date  . KNEE ARTHROSCOPY W/ ACL RECONSTRUCTION     right  . LITHOTRIPSY    . thumb surgery      There were no vitals filed for this visit.  Subjective Assessment - 05/28/19 0823    Subjective  More soreness than pain he has been doing his exercises.  Rt shoulder blade bothers him the mosit.    Pertinent History  ACDF C5-6 on 03/25/19    Limitations  Lifting;House hold activities    Patient Stated Goals  To have decreased pain and stiffness    Currently in Pain?  Yes    Pain Score  5     Pain Location  Back    Pain Orientation  Right    Pain Descriptors / Indicators  Sore    Pain Type  Acute pain    Pain Onset  More than a month ago    Aggravating Factors   turning his head    Pain Relieving Factors  moving         [facility] PT Assessment - 05/28/19 0001      Observation/Other Assessments   Focus on  Therapeutic Outcomes (FOTO)   61                   [facility] Adult PT Treatment/Exercise - 05/28/19 0001      Exercises   Exercises  Neck      Neck Exercises: Seated   Neck Retraction  10 reps    Other Seated Exercise  scapular retraction x10, cervical and shoulder 3D excursions x 3 each       Manual Therapy   Manual Therapy  Joint mobilization;Soft tissue mobilization    Manual therapy comments  completed seperate from all other aspects of treatment    Joint Mobilization  grade II and III from T3-T10 to improve mobility     Soft tissue mobilization  To upper and middle trap as well as thoracic paraspinal mm to decrease spasms              PT Education - 05/28/19 0917    Education Details  New HEP    Person(s) Educated  Patient    Methods  Explanation;Demonstration;Verbal cues;Handout    Comprehension  Verbalized understanding;Returned demonstration       PT Short Term Goals - 05/28/19 0836      PT SHORT TERM GOAL #1   Title  Patient will report understanding and regular compliance with HEP to improve ROM, decrease pain and improve overall functional mobility.    Time  2    Period  Weeks    Status  On-going    Target Date  06/06/19        PT Long Term Goals - 05/28/19 0837      PT LONG TERM GOAL #1   Title  Patient will demonstrate cervical AROM WFL without pain in order to improve ability to tolerate daily activities.    Time  4    Period  Weeks    Status  On-going      PT LONG TERM GOAL #2   Title  Patient will demonstrate improvement in cervical deep flexor endurance test of at least 10 seconds in order to improve mechanics and posture to decrease frequency of painful symptoms.    Time  4    Period  Weeks    Status  On-going      PT LONG TERM GOAL #3   Title  Patient will report a decrease in frequency of symptoms of at least 50% in order to improve tolerance to daily activities.    Time  4    Period  Weeks    Status  On-going             Plan - 05/28/19 9169    Clinical Impression Statement  Reviewed evaluation and goals, completed foto.  Added exercises per flowsheet to pt HEP with no questions or complaints.  Manual started with noted mm spasms in B upper trapezius mm with RT greater than LT; noted mm spasm in paraspinal thoracic area as well.  Musculature responded well to manual.    Personal Factors and Comorbidities  Comorbidity 1    Comorbidities  HTN    Examination-Activity Limitations  Lift;Reach Overhead    Examination-Participation Restrictions  Community Activity;Cleaning    Stability/Clinical Decision Making  Stable/Uncomplicated    Rehab Potential  Good    PT Frequency  2x / week    PT Duration  4 weeks    PT Treatment/Interventions  ADLs/Self Care Home Management;Electrical Stimulation;Cryotherapy;Moist Heat;Functional mobility training;Therapeutic activities;Therapeutic exercise;Neuromuscular re-education;Patient/family education;Manual techniques;Passive range of motion;Dry needling;Energy conservation;Taping    PT Next Visit Plan  Continue Thoracic joint mobilizations, soft tissue mobilization . Postural and Deep cervical flexor strengthening.    PT Home Exercise Plan  05/23/19: Gentle cervical AROM all directions 10x daily    Consulted and Agree with Plan of Care  Patient       Patient will benefit from skilled therapeutic intervention in order to improve the following deficits and impairments:  Pain, Improper body mechanics, Increased fascial restricitons, Decreased mobility, Postural dysfunction, Decreased activity tolerance, Decreased endurance, Decreased range of motion, Decreased strength, Hypomobility, Impaired UE functional use  Visit Diagnosis: Cervicalgia  Muscle weakness (generalized)     Problem List Patient Active Problem List   Diagnosis Date Noted  . Essential hypertension 02/26/2017  . Kidney stones 02/26/2017  . Overweight 02/26/2017  . Mood disorder [facility]) 02/26/2017    Virgina Organ, PT CLT [phone] 05/28/2019, 9:21 AM  [place] [facility] [address] [place], [place], [address] Phone: [phone]   Fax:  [phone]  Name: RONELLE SMALLMAN  MRN: [id number] Date of Birth: [birth year]

## 2019-05-30 ENCOUNTER — Other Ambulatory Visit: Payer: Self-pay

## 2019-05-30 ENCOUNTER — Encounter (HOSPITAL_COMMUNITY): Payer: Self-pay | Admitting: Physical Therapy

## 2019-05-30 ENCOUNTER — Ambulatory Visit (HOSPITAL_COMMUNITY): Payer: Commercial Managed Care - PPO | Admitting: Physical Therapy

## 2019-05-30 DIAGNOSIS — R29898 Other symptoms and signs involving the musculoskeletal system: Secondary | ICD-10-CM

## 2019-05-30 DIAGNOSIS — M6281 Muscle weakness (generalized): Secondary | ICD-10-CM

## 2019-05-30 DIAGNOSIS — M542 Cervicalgia: Secondary | ICD-10-CM

## 2019-05-30 NOTE — Therapy (Signed)
[facility] [address] [place], [place], [address] Phone: [phone]   Fax:  [phone]  Physical Therapy Treatment  Patient Details  Name: Alexander Osborn MRN: [id number] Date of Birth: [birth year] Referring Provider (PT): Estill Bamberg, MD   Encounter Date: 05/30/2019  PT End of Session - 05/30/19 0957    Visit Number  3    Number of Visits  8    Date for PT Re-Evaluation  06/20/19    Authorization Type  [employer] PPO (30 visit limit PT/OT/Manipulations compbined)    Authorization Time Period  05/23/19-06/20/19    Authorization - Visit Number  3    Authorization - Number of Visits  30    PT Start Time  [phone]    PT Stop Time  1029    PT Time Calculation (min)  38 min    Activity Tolerance  Patient tolerated treatment well    Behavior During Therapy  [facility] for tasks assessed/performed       Past Medical History:  Diagnosis Date  . Allergy    ampicillin  . Anxiety   . Depression   . GERD (gastroesophageal reflux disease)   . Hypertension   . Kidney stones 02/26/2017    Past Surgical History:  Procedure Laterality Date  . KNEE ARTHROSCOPY W/ ACL RECONSTRUCTION     right  . LITHOTRIPSY    . thumb surgery      There were no vitals filed for this visit.  Subjective Assessment - 05/30/19 0956    Subjective  Patient reported 2/10 pain this session.    Pertinent History  ACDF C5-6 on 03/25/19    Limitations  Lifting;House hold activities    Patient Stated Goals  To have decreased pain and stiffness    Currently in Pain?  Yes    Pain Score  2     Pain Location  Shoulder    Pain Orientation  Right    Pain Descriptors / Indicators  Tightness    Pain Type  Acute pain    Pain Onset  More than a month ago                       [facility] Adult PT Treatment/Exercise - 05/30/19 0001      Neck Exercises: Seated   Neck Retraction  10 reps    Neck Retraction Limitations  5'' holds    Shoulder Rolls  Backwards;10 reps     Other Seated Exercise  scapular retraction x15, cervical 3D excursions x 5 each       Manual Therapy   Manual Therapy  Joint mobilization;Soft tissue mobilization    Manual therapy comments  completed seperate from all other aspects of treatment    Joint Mobilization  grade II and III from T3-T10 to improve mobility     Soft tissue mobilization  To upper and middle trap as well as thoracic paraspinal mm to decrease spasms                PT Short Term Goals - 05/28/19 0836      PT SHORT TERM GOAL #1   Title  Patient will report understanding and regular compliance with HEP to improve ROM, decrease pain and improve overall functional mobility.    Time  2    Period  Weeks    Status  On-going    Target Date  06/06/19        PT Long Term Goals -  05/28/19 0837      PT LONG TERM GOAL #1   Title  Patient will demonstrate cervical AROM WFL without pain in order to improve ability to tolerate daily activities.    Time  4    Period  Weeks    Status  On-going      PT LONG TERM GOAL #2   Title  Patient will demonstrate improvement in cervical deep flexor endurance test of at least 10 seconds in order to improve mechanics and posture to decrease frequency of painful symptoms.    Time  4    Period  Weeks    Status  On-going      PT LONG TERM GOAL #3   Title  Patient will report a decrease in frequency of symptoms of at least 50% in order to improve tolerance to daily activities.    Time  4    Period  Weeks    Status  On-going            Plan - 05/30/19 1046    Clinical Impression Statement  This session continued with established plan of care. Added standing shoulder flexion with cervical spine supported against the wall. Focused on manual therapy this session including soft tissue mobilization and thoracic spine joint mobilizations to improve mobility and decrease pain. Patient reported an improvement in symptoms following manual therapy. Patient would benefit from  continued skilled physical therapy  in order to continue progressing towards functional goals.    Personal Factors and Comorbidities  Comorbidity 1    Comorbidities  HTN    Examination-Activity Limitations  Lift;Reach Overhead    Examination-Participation Restrictions  Community Activity;Cleaning    Stability/Clinical Decision Making  Stable/Uncomplicated    Rehab Potential  Good    PT Frequency  2x / week    PT Duration  4 weeks    PT Treatment/Interventions  ADLs/Self Care Home Management;Electrical Stimulation;Cryotherapy;Moist Heat;Functional mobility training;Therapeutic activities;Therapeutic exercise;Neuromuscular re-education;Patient/family education;Manual techniques;Passive range of motion;Dry needling;Energy conservation;Taping    PT Next Visit Plan  Continue Thoracic joint mobilizations, soft tissue mobilization . Postural and Deep cervical flexor strengthening.    PT Home Exercise Plan  05/23/19: Gentle cervical AROM all directions 10x daily    Consulted and Agree with Plan of Care  Patient       Patient will benefit from skilled therapeutic intervention in order to improve the following deficits and impairments:  Pain, Improper body mechanics, Increased fascial restricitons, Decreased mobility, Postural dysfunction, Decreased activity tolerance, Decreased endurance, Decreased range of motion, Decreased strength, Hypomobility, Impaired UE functional use  Visit Diagnosis: Cervicalgia  Muscle weakness (generalized)  Other symptoms and signs involving the musculoskeletal system     Problem List Patient Active Problem List   Diagnosis Date Noted  . Essential hypertension 02/26/2017  . Kidney stones 02/26/2017  . Overweight 02/26/2017  . Mood disorder ([place]) 02/26/2017   Clarene Critchley PT, DPT 10:48 AM, 05/30/19 [place], [place], [address] Phone: [phone]   Fax:  [phone]  Name:  Alexander Osborn MRN: [id number] Date of Birth: July 01, 1971

## 2019-06-03 ENCOUNTER — Encounter (HOSPITAL_COMMUNITY): Payer: Self-pay

## 2019-06-03 ENCOUNTER — Other Ambulatory Visit: Payer: Self-pay

## 2019-06-03 ENCOUNTER — Ambulatory Visit (HOSPITAL_COMMUNITY): Payer: Commercial Managed Care - PPO

## 2019-06-03 DIAGNOSIS — M542 Cervicalgia: Secondary | ICD-10-CM

## 2019-06-03 DIAGNOSIS — R29898 Other symptoms and signs involving the musculoskeletal system: Secondary | ICD-10-CM

## 2019-06-03 DIAGNOSIS — M6281 Muscle weakness (generalized): Secondary | ICD-10-CM

## 2019-06-03 NOTE — Therapy (Signed)
[facility] [address] [place], [place], [address] Phone: [phone]   Fax:  [phone]  Physical Therapy Treatment  Patient Details  Name: Alexander Osborn MRN: [id number] Date of Birth: Jan 17, 1971 Referring Provider (PT): Estill Bamberg, MD   Encounter Date: 06/03/2019  PT End of Session - 06/03/19 1100    Visit Number  4    Number of Visits  8    Date for PT Re-Evaluation  06/20/19    Authorization Type  UMR/[employer] PPO (30 visit limit PT/OT/Manipulations compbined)    Authorization Time Period  05/23/19-06/20/19    Authorization - Visit Number  4    Authorization - Number of Visits  30    PT Start Time  1050    PT Stop Time  1130    PT Time Calculation (min)  40 min    Activity Tolerance  Patient tolerated treatment well    Behavior During Therapy  [facility] for tasks assessed/performed       Past Medical History:  Diagnosis Date  . Allergy    ampicillin  . Anxiety   . Depression   . GERD (gastroesophageal reflux disease)   . Hypertension   . Kidney stones 02/26/2017    Past Surgical History:  Procedure Laterality Date  . KNEE ARTHROSCOPY W/ ACL RECONSTRUCTION     right  . LITHOTRIPSY    . thumb surgery      There were no vitals filed for this visit.  Subjective Assessment - 06/03/19 1057    Subjective  Pt reports he had some pain and headache yesterday while working, no reports of pain just stiff today.  Reports compliance with HEP daily, no questions concerning.    Patient Stated Goals  To have decreased pain and stiffness    Currently in Pain?  No/denies         [facility] PT Assessment - 06/03/19 0001      Assessment   Next MD Visit  10/13      Precautions   Precautions  Cervical    Precaution Comments  No lifting more than 50 pounds   Currently limited by 10 pounds                  [facility] Adult PT Treatment/Exercise - 06/03/19 0001      Posture/Postural Control   Posture/Postural Control   Postural limitations    Postural Limitations  Rounded Shoulders;Increased thoracic kyphosis      Exercises   Exercises  Neck      Neck Exercises: Theraband   Shoulder Extension  15 reps;Red    Rows  15 reps;Red      Neck Exercises: Standing   Upper Extremity Flexion with Stabilization  Flexion;10 reps    UE Flexion with Stabilization Limitations  Cervical retraction against wall with UE flexion 10      Neck Exercises: Seated   Neck Retraction  10 reps    W Back  10 reps    Shoulder Rolls  Backwards;10 reps    Shoulder Rolls Limitations  up back and down    Other Seated Exercise  cervical and thoracic excursion 10x      Manual Therapy   Manual Therapy  Soft tissue mobilization;Other (comment)    Manual therapy comments  completed seperate from all other aspects of treatment    Soft tissue mobilization  To upper and middle trap as well as thoracic paraspinal mm to decrease spasms     Other  Manual Therapy  suboccitial release x 2 min               PT Short Term Goals - 05/28/19 0836      PT SHORT TERM GOAL #1   Title  Patient will report understanding and regular compliance with HEP to improve ROM, decrease pain and improve overall functional mobility.    Time  2    Period  Weeks    Status  On-going    Target Date  06/06/19        PT Long Term Goals - 05/28/19 0837      PT LONG TERM GOAL #1   Title  Patient will demonstrate cervical AROM WFL without pain in order to improve ability to tolerate daily activities.    Time  4    Period  Weeks    Status  On-going      PT LONG TERM GOAL #2   Title  Patient will demonstrate improvement in cervical deep flexor endurance test of at least 10 seconds in order to improve mechanics and posture to decrease frequency of painful symptoms.    Time  4    Period  Weeks    Status  On-going      PT LONG TERM GOAL #3   Title  Patient will report a decrease in frequency of symptoms of at least 50% in order to improve tolerance  to daily activities.    Time  4    Period  Weeks    Status  On-going            Plan - 06/03/19 1252    Clinical Impression Statement  Began session with thoracic excursions for cervical and thoracic mobility.  Added postural strengthening exercises including W-back, standing UE flexion with cervical retraction and theraband activities with good form folloiwng initial instructions.  EOS with manual soft tissue mobilization to address restrictions.  Noted tension in suboccipital region, added suboccipitial release with reports of relief.  Pt encouraged to stay hydrated following manual to reduce risk of headache following session.    Personal Factors and Comorbidities  Comorbidity 1    Comorbidities  HTN    Examination-Activity Limitations  Lift;Reach Overhead    Examination-Participation Restrictions  Community Activity;Cleaning    Stability/Clinical Decision Making  Stable/Uncomplicated    Clinical Decision Making  Low    Rehab Potential  Good    PT Frequency  2x / week    PT Duration  4 weeks    PT Treatment/Interventions  ADLs/Self Care Home Management;Electrical Stimulation;Cryotherapy;Moist Heat;Functional mobility training;Therapeutic activities;Therapeutic exercise;Neuromuscular re-education;Patient/family education;Manual techniques;Passive range of motion;Dry needling;Energy conservation;Taping    PT Next Visit Plan  Continue Thoracic joint mobilizations, soft tissue mobilization . Postural and Deep cervical flexor strengthening.    PT Home Exercise Plan  05/23/19: Gentle cervical AROM all directions 10x daily       Patient will benefit from skilled therapeutic intervention in order to improve the following deficits and impairments:  Pain, Improper body mechanics, Increased fascial restricitons, Decreased mobility, Postural dysfunction, Decreased activity tolerance, Decreased endurance, Decreased range of motion, Decreased strength, Hypomobility, Impaired UE functional  use  Visit Diagnosis: Other symptoms and signs involving the musculoskeletal system  Muscle weakness (generalized)  Cervicalgia     Problem List Patient Active Problem List   Diagnosis Date Noted  . Essential hypertension 02/26/2017  . Kidney stones 02/26/2017  . Overweight 02/26/2017  . Mood disorder ([facility]) 02/26/2017   Becky Saxasey [place], LPTA; CBIS [phone]  Aldona Lento 06/03/2019, 12:59 PM  [facility] [address] [place], [place], [address] Phone: [phone]   Fax:  [phone]  Name: CHARISTOPHER RUMBLE MRN: [id number] Date of Birth: 1971/06/02

## 2019-06-05 ENCOUNTER — Ambulatory Visit (HOSPITAL_COMMUNITY): Payer: Commercial Managed Care - PPO

## 2019-06-05 ENCOUNTER — Telehealth (HOSPITAL_COMMUNITY): Payer: Self-pay | Admitting: *Deleted

## 2019-06-05 NOTE — Telephone Encounter (Signed)
06/05/19  pt called to cx said that he had to be at home with his daughter this morning

## 2019-06-10 ENCOUNTER — Encounter (HOSPITAL_COMMUNITY): Payer: Self-pay | Admitting: Physical Therapy

## 2019-06-10 ENCOUNTER — Other Ambulatory Visit: Payer: Self-pay

## 2019-06-10 ENCOUNTER — Ambulatory Visit (HOSPITAL_COMMUNITY): Payer: Commercial Managed Care - PPO | Attending: Orthopedic Surgery | Admitting: Physical Therapy

## 2019-06-10 DIAGNOSIS — M542 Cervicalgia: Secondary | ICD-10-CM

## 2019-06-10 DIAGNOSIS — R29898 Other symptoms and signs involving the musculoskeletal system: Secondary | ICD-10-CM

## 2019-06-10 DIAGNOSIS — M6281 Muscle weakness (generalized): Secondary | ICD-10-CM | POA: Diagnosis present

## 2019-06-10 NOTE — Therapy (Signed)
[facility] [address] [place], [place], [address] Phone: [phone]   Fax:  [phone]  Physical Therapy Treatment  Patient Details  Name: Alexander Osborn MRN: [id number] Date of Birth: [birth year] Referring Provider (PT): Estill BambergMark Dumonski, MD   Encounter Date: 06/10/2019  PT End of Session - 06/10/19 0903    Visit Number  5    Number of Visits  8    Date for PT Re-Evaluation  06/20/19    Authorization Type  [employer]R/[employer] PPO (30 visit limit PT/OT/Manipulations compbined)    Authorization Time Period  05/23/19-06/20/19    Authorization - Visit Number  5    Authorization - Number of Visits  30    PT Start Time  0900    PT Stop Time  0940    PT Time Calculation (min)  40 min    Activity Tolerance  Patient tolerated treatment well    Behavior During Therapy  [facility] assessed/per[facility]       Past Medical History:  Diagnosis Date  . Allergy    ampicillin  . Anxiety   . Depression   . GERD (gastroesophageal reflux disease)   . Hypertension   . Kidney stones 02/26/2017    Past Surgical History:  Procedure Laterality Date  . KNEE ARTHROSCOPY W/ ACL RECONSTRUCTION     right  . LITHOTRIPSY    . thumb surgery      There were no vitals filed for this visit.  Subjective Assessment - 06/10/19 0901    Subjective  Patient reports he is doing well today. Patient states he is still having intermittent headaches, usually in the afternoon. Patient says headaches usually last about an hour and occur toward the end of day after he is sitting or using the computor for long periods. Patient denies any pain currenlty, just stiffness in back of neck area.    Patient Stated Goals  To have decreased pain and stiffness    Currently in Pain?  No/denies                       [facility] - 06/10/19 0001      Neck Exercises: Theraband   Shoulder Extension  15 reps;Green   2 sets   Rows  15 reps;Green   2  sets     Neck Exercises: Seated   Neck Retraction  10 reps    Shoulder Rolls  Backwards;10 reps    Shoulder Rolls Limitations  up back and down    Other Seated Exercise  thoracic excursions flexion/ extension, sidebending both sides, rotation both sides; 5x each    Other Seated Exercise  cervical excursion, flexion/ extension, rotation, bilateral lateral flexion, x10 each      Neck Exercises: Prone   W Back  15 reps    Other Prone Exercise  Prone Y, BUE, 5 reps      Manual Therapy   Manual Therapy  Soft tissue mobilization;Joint mobilization    Manual therapy comments  completed seperate from all other aspects of treatment    Joint Mobilization  grade II and III P/A, from T3-T10 to improve mobility              PT Education - 06/10/19 0933    Education Details  Exercise thechnique and HEP    Person(s) Educated  Patient    Methods  Explanation    Comprehension  Verbalized understanding  PT Short Term Goals - 05/28/19 0836      PT SHORT TERM GOAL #1   Title  Patient will report understanding and regular compliance with HEP to improve ROM, decrease pain and improve overall functional mobility.    Time  2    Period  Weeks    Status  On-going    Target Date  06/06/19        PT Long Term Goals - 05/28/19 0837      PT LONG TERM GOAL #1   Title  Patient will demonstrate cervical AROM WFL without pain in order to improve ability to tolerate daily activities.    Time  4    Period  Weeks    Status  On-going      PT LONG TERM GOAL #2   Title  Patient will demonstrate improvement in cervical deep flexor endurance test of at least 10 seconds in order to improve mechanics and posture to decrease frequency of painful symptoms.    Time  4    Period  Weeks    Status  On-going      PT LONG TERM GOAL #3   Title  Patient will report a decrease in frequency of symptoms of at least 50% in order to improve tolerance to daily activities.    Time  4    Period  Weeks     Status  On-going            Plan - 06/10/19 0938    Clinical Impression Statement  Began session with seated cervical and thoracic mobility exercise. Progressed patient to green band with standing scapular strengthening. Patient progressed W back exercise to prone position, and added prone Ys, but patient noted increased discomfort in LT scapular region. Manual therapy was performed to address thoracic spine mobility restriction, and increased muscle tone about cervical paraspinal muscles. Patient with noted trigger point about LT scapular border.    Personal Factors and Comorbidities  Comorbidity 1    Comorbidities  HTN    Examination-Activity Limitations  Lift;Reach Overhead    Examination-Participation Restrictions  Community Activity;Cleaning    Stability/Clinical Decision Making  Stable/Uncomplicated    Rehab Potential  Good    PT Frequency  2x / week    PT Duration  4 weeks    PT Treatment/Interventions  ADLs/Self Care Home Management;Electrical Stimulation;Cryotherapy;Moist Heat;Functional mobility training;Therapeutic activities;Therapeutic exercise;Neuromuscular re-education;Patient/family education;Manual techniques;Passive range of motion;Dry needling;Energy conservation;Taping    PT Next Visit Plan  Continue with focus on reducing cervical and thoracic mobility restriciton. Continue targeted manual therapy to reduce increased muscle tone in LT cervical and scapular region.    PT Home Exercise Plan  05/23/19: Gentle cervical AROM all directions 10x daily       Patient will benefit from skilled therapeutic intervention in order to improve the following deficits and impairments:  Pain, Improper body mechanics, Increased fascial restricitons, Decreased mobility, Postural dysfunction, Decreased activity tolerance, Decreased endurance, Decreased range of motion, Decreased strength, Hypomobility, Impaired UE functional use  Visit Diagnosis: Other symptoms and signs involving the  musculoskeletal system  Muscle weakness (generalized)  Cervicalgia     Problem List Patient Active Problem List   Diagnosis Date Noted  . Essential hypertension 02/26/2017  . Kidney stones 02/26/2017  . Overweight 02/26/2017  . Mood disorder ([facility]CC) 02/26/2017   Verne Carrow PT, DPT 12:48 PM, 06/10/19 [phone]  [facility] [address] [place], [place], [address] Phone: [phone]   Fax:  [phone]  Name: Alexander Osborn MRN: [id number] Date of Birth: 12/22/1970

## 2019-06-12 ENCOUNTER — Encounter (HOSPITAL_COMMUNITY): Payer: Self-pay | Admitting: Physical Therapy

## 2019-06-12 ENCOUNTER — Other Ambulatory Visit: Payer: Self-pay

## 2019-06-12 ENCOUNTER — Ambulatory Visit (HOSPITAL_COMMUNITY): Payer: Commercial Managed Care - PPO | Admitting: Physical Therapy

## 2019-06-12 DIAGNOSIS — R29898 Other symptoms and signs involving the musculoskeletal system: Secondary | ICD-10-CM | POA: Diagnosis not present

## 2019-06-12 DIAGNOSIS — M6281 Muscle weakness (generalized): Secondary | ICD-10-CM

## 2019-06-12 DIAGNOSIS — M542 Cervicalgia: Secondary | ICD-10-CM

## 2019-06-12 NOTE — Therapy (Signed)
[facility] [address] [place], [place], [address] Phone: [phone]   Fax:  [phone]  Physical Therapy Treatment  Patient Details  Name: Alexander Osborn MRN: [id number] Date of Birth: 27-Mar-1971 Referring Provider (PT): Estill Bamberg, MD   Encounter Date: 06/12/2019  PT End of Session - 06/12/19 0909    Visit Number  6    Number of Visits  8    Date for PT Re-Evaluation  06/20/19    Authorization Type  [employer] PPO (30 visit limit PT/OT/Manipulations compbined)    Authorization Time Period  05/23/19-06/20/19    Authorization - Visit Number  6    Authorization - Number of Visits  30    PT Start Time  0905    PT Stop Time  0945    PT Time Calculation (min)  40 min    Activity Tolerance  Patient tolerated treatment well    Behavior During Therapy  [facility] : [facility] for tasks assessed/performed       Past Medical History:  Diagnosis Date  . Allergy    ampicillin  . Anxiety   . Depression   . GERD (gastroesophageal reflux disease)   . Hypertension   . Kidney stones 02/26/2017    Past Surgical History:  Procedure Laterality Date  . KNEE ARTHROSCOPY W/ ACL RECONSTRUCTION     right  . LITHOTRIPSY    . thumb surgery      There were no vitals filed for this visit.  Subjective Assessment - 06/12/19 0905    Subjective  Patient denied any pain currently.    Patient Stated Goals  To have decreased pain and stiffness    Currently in Pain?  No/denies                       [facility] Adult PT Treatment/Exercise - 06/12/19 0001      Neck Exercises: Theraband   Shoulder Extension  15 reps;Green   2 sets   Rows  15 reps;Green   2 sets     Neck Exercises: Seated   Neck Retraction  10 reps    Shoulder Rolls  Backwards;10 reps    Shoulder Rolls Limitations  up back and down    Other Seated Exercise  thoracic excursions flexion/ extension, sidebending both sides, rotation both sides; 10x each    Other Seated Exercise  cervical  excursion, flexion/ extension, rotation, bilateral lateral flexion, x10 each      Neck Exercises: Prone   W Back  15 reps    Other Prone Exercise  Prone Y, BUE, 5 reps      Manual Therapy   Manual Therapy  Soft tissue mobilization;Joint mobilization    Manual therapy comments  completed seperate from all other aspects of treatment    Joint Mobilization  grade II and III P/A, from T3-T10 to improve mobility                PT Short Term Goals - 05/28/19 0836      PT SHORT TERM GOAL #1   Title  Patient will report understanding and regular compliance with HEP to improve ROM, decrease pain and improve overall functional mobility.    Time  2    Period  Weeks    Status  On-going    Target Date  06/06/19        PT Long Term Goals - 05/28/19 0837      PT LONG TERM GOAL #1  Title  Patient will demonstrate cervical AROM WFL without pain in order to improve ability to tolerate daily activities.    Time  4    Period  Weeks    Status  On-going      PT LONG TERM GOAL #2   Title  Patient will demonstrate improvement in cervical deep flexor endurance test of at least 10 seconds in order to improve mechanics and posture to decrease frequency of painful symptoms.    Time  4    Period  Weeks    Status  On-going      PT LONG TERM GOAL #3   Title  Patient will report a decrease in frequency of symptoms of at least 50% in order to improve tolerance to daily activities.    Time  4    Period  Weeks    Status  On-going            Plan - 06/12/19 0949    Clinical Impression Statement  Continued with established POC this session. Patient reported a reduction in pain symptoms following last session with the trigger point release and therefore continued soft tissue mobilization this session with noted decrease in muscular restrictions. Patient required decreased cueing for form this session. Patient tolerated all interventions well without reports of increased pain.    Personal  Factors and Comorbidities  Comorbidity 1    Comorbidities  HTN    Examination-Activity Limitations  Lift;Reach Overhead    Examination-Participation Restrictions  Community Activity;Cleaning    Stability/Clinical Decision Making  Stable/Uncomplicated    Rehab Potential  Good    PT Frequency  2x / week    PT Duration  4 weeks    PT Treatment/Interventions  ADLs/Self Care Home Management;Electrical Stimulation;Cryotherapy;Moist Heat;Functional mobility training;Therapeutic activities;Therapeutic exercise;Neuromuscular re-education;Patient/family education;Manual techniques;Passive range of motion;Dry needling;Energy conservation;Taping    PT Next Visit Plan  Update HEP next session. Continue with focus on reducing cervical and thoracic mobility restriciton. Continue targeted manual therapy to reduce increased muscle tone in LT cervical and scapular region.    PT Home Exercise Plan  05/23/19: Gentle cervical AROM all directions 10x daily       Patient will benefit from skilled therapeutic intervention in order to improve the following deficits and impairments:  Pain, Improper body mechanics, Increased fascial restricitons, Decreased mobility, Postural dysfunction, Decreased activity tolerance, Decreased endurance, Decreased range of motion, Decreased strength, Hypomobility, Impaired UE functional use  Visit Diagnosis: Cervicalgia  Other symptoms and signs involving the musculoskeletal system  Muscle weakness (generalized)     Problem List Patient Active Problem List   Diagnosis Date Noted  . Essential hypertension 02/26/2017  . Kidney stones 02/26/2017  . Overweight 02/26/2017  . Mood disorder ([place]) 02/26/2017   Clarene Critchley PT, DPT 9:51 AM, 06/12/19 [place], [place], [address] Phone: [phone]   Fax:  [phone]  Name: Alexander Osborn MRN: [id number] Date of Birth: 16-Oct-1970

## 2019-06-18 ENCOUNTER — Telehealth (HOSPITAL_COMMUNITY): Payer: Self-pay | Admitting: Physical Therapy

## 2019-06-18 ENCOUNTER — Ambulatory Visit (HOSPITAL_COMMUNITY): Payer: Commercial Managed Care - PPO | Admitting: Physical Therapy

## 2019-06-18 NOTE — Telephone Encounter (Addendum)
Called patient and talked to his wife, to remind him of missed appointment this morning. His wife says he was cleared by MD to return to work full time, and forgot his appointment this AM. Reminded patient of next appointment.   Josue Hector PT DPT

## 2019-06-20 ENCOUNTER — Telehealth (HOSPITAL_COMMUNITY): Payer: Self-pay | Admitting: Physical Therapy

## 2019-06-20 ENCOUNTER — Ambulatory Visit (HOSPITAL_COMMUNITY): Payer: Commercial Managed Care - PPO | Admitting: Physical Therapy

## 2019-06-20 NOTE — Telephone Encounter (Signed)
pt called to cancel today's appt and states he has gotten the ok to return to work and he would like to be discharged. SAYS [place] EVERYTHING.

## 2019-09-12 ENCOUNTER — Encounter (HOSPITAL_COMMUNITY): Payer: Self-pay | Admitting: Physical Therapy

## 2019-09-12 NOTE — Therapy (Signed)
Alexander Osborn, [place], [address] Phone: [phone]   Fax:  [phone]  Patient Details  Name: Alexander Osborn MRN: [id number] Date of Birth: 07/05/1971 Referring Provider:  No ref. provider found  Encounter Date: 09/12/2019   PHYSICAL THERAPY DISCHARGE SUMMARY  Visits from Start of Care: 6  Current functional level related to goals / functional outcomes: Patient did not return following visit 6. He reported returning to work and requested discharge. His functional level is unknown as formal re-assessment was not performed.    Remaining deficits: Patient did not return following visit 6. He reported returning to work and requested discharge. His functional level is unknown as formal re-assessment was not performed.    Education / Equipment: Had been educated on HEP Plan: Patient agrees to discharge.  Patient goals were partially met. Patient is being discharged due to the patient's request.  ?????      Clarene Critchley PT, DPT 3:23 PM, 09/12/19 [place], [place], [address] Phone: [phone]   Fax:  [phone]

## 2019-11-24 ENCOUNTER — Other Ambulatory Visit: Payer: Self-pay

## 2019-11-24 ENCOUNTER — Ambulatory Visit: Payer: Commercial Managed Care - PPO | Attending: Internal Medicine

## 2019-11-24 ENCOUNTER — Other Ambulatory Visit: Payer: Commercial Managed Care - PPO

## 2019-11-24 DIAGNOSIS — Z20822 Contact with and (suspected) exposure to covid-19: Secondary | ICD-10-CM

## 2019-11-25 LAB — SARS-COV-2, NAA 2 DAY TAT

## 2019-11-25 LAB — NOVEL CORONAVIRUS, NAA: SARS-CoV-2, NAA: NOT DETECTED

## 2020-10-06 ENCOUNTER — Emergency Department (HOSPITAL_COMMUNITY)
Admission: EM | Admit: 2020-10-06 | Discharge: 2020-10-06 | Disposition: A | Discharge status: Home / Self Care | Payer: Commercial Managed Care - PPO | Attending: Emergency Medicine | Admitting: Emergency Medicine

## 2020-10-06 ENCOUNTER — Emergency Department (HOSPITAL_COMMUNITY): Payer: Commercial Managed Care - PPO

## 2020-10-06 ENCOUNTER — Encounter (HOSPITAL_COMMUNITY): Payer: Self-pay

## 2020-10-06 ENCOUNTER — Other Ambulatory Visit: Payer: Self-pay

## 2020-10-06 DIAGNOSIS — Z87891 Personal history of nicotine dependence: Secondary | ICD-10-CM | POA: Insufficient documentation

## 2020-10-06 DIAGNOSIS — R0789 Other chest pain: Secondary | ICD-10-CM | POA: Diagnosis not present

## 2020-10-06 DIAGNOSIS — Z79899 Other long term (current) drug therapy: Secondary | ICD-10-CM | POA: Diagnosis not present

## 2020-10-06 DIAGNOSIS — I10 Essential (primary) hypertension: Secondary | ICD-10-CM | POA: Insufficient documentation

## 2020-10-06 DIAGNOSIS — R110 Nausea: Secondary | ICD-10-CM | POA: Diagnosis not present

## 2020-10-06 DIAGNOSIS — R072 Precordial pain: Secondary | ICD-10-CM

## 2020-10-06 LAB — BASIC METABOLIC PANEL
Anion gap: 7 (ref 5–15)
BUN: 18 mg/dL (ref 6–20)
CO2: 24 mmol/L (ref 22–32)
Calcium: 9.5 mg/dL (ref 8.9–10.3)
Chloride: 106 mmol/L (ref 98–111)
Creatinine, Ser: 1.19 mg/dL (ref 0.61–1.24)
GFR, Estimated: >60 mL/min (ref >60)
Glucose, Bld: 89 mg/dL (ref 70–99)
Potassium: 3.9 mmol/L (ref 3.5–5.1)
Sodium: 137 mmol/L (ref 135–145)

## 2020-10-06 LAB — CBC
HCT: 47.7 % (ref 39.0–52.0)
Hemoglobin: 15.7 g/dL (ref 13.0–17.0)
MCH: 29.6 pg (ref 26.0–34.0)
MCHC: 32.9 g/dL (ref 30.0–36.0)
MCV: 90 fL (ref 80.0–100.0)
Platelets: 178 10*3/uL (ref 150–400)
RBC: 5.3 MIL/uL (ref 4.22–5.81)
RDW: 13.8 % (ref 11.5–15.5)
WBC: 5.5 10*3/uL (ref 4.0–10.5)
nRBC: 0 % (ref 0.0–0.2)

## 2020-10-06 LAB — CBG MONITORING, ED: Glucose-Capillary: 91 mg/dL (ref 70–99)

## 2020-10-06 LAB — TROPONIN I (HIGH SENSITIVITY)
Troponin I (High Sensitivity): <2 ng/L (ref <18)
Troponin I (High Sensitivity): <2 ng/L (ref <18)

## 2020-10-06 MED ORDER — NITROGLYCERIN 0.4 MG SL SUBL
0.4000 mg | SUBLINGUAL_TABLET | SUBLINGUAL | Status: DC | PRN
  Administered 2020-10-06: 0.4 mg via SUBLINGUAL
  Filled 2020-10-06: qty 1

## 2020-10-06 MED ORDER — ACETAMINOPHEN 325 MG PO TABS
650.0000 mg | ORAL_TABLET | Freq: Once | ORAL | Status: AC
  Administered 2020-10-06: 650 mg via ORAL
  Filled 2020-10-06: qty 2

## 2020-10-06 MED ORDER — ASPIRIN 81 MG PO CHEW
324.0000 mg | CHEWABLE_TABLET | Freq: Once | ORAL | Status: AC
  Administered 2020-10-06: 324 mg via ORAL
  Filled 2020-10-06: qty 4

## 2020-10-06 NOTE — ED Provider Notes (Signed)
[facility] EMERGENCY DEPARTMENT Provider Note   CSN: [id number] Arrival date & time: 10/06/20  1017     History Chief Complaint  Patient presents with  . Chest Pain    Alexander Osborn is a 50 y.o. male.  HPI  HPI: A 50 year old patient with a history of hypertension and obesity presents for evaluation of chest pain. Initial onset of pain was approximately 3-6 hours ago. The patient's chest pain is described as heaviness/pressure/tightness and is not worse with exertion. The patient complains of nausea. The patient's chest pain is middle- or left-sided, is not well-localized, is not sharp and does radiate to the arms/jaw/neck. The patient denies diaphoresis. The patient has smoked in the past 90 days. The patient has no history of stroke, has no history of peripheral artery disease, denies any history of treated diabetes, has no relevant family history of coronary artery disease (first degree relative at less than age 25) and has no history of hypercholesterolemia.   Patient comes in with midsternal and left-sided chest pain with radiation to the back.  History of GERD and diet-controlled hypertension.  No history of chest pain like this and typically his GERD pain gets better with Tums.   Past Medical History:  Diagnosis Date  . Allergy    ampicillin  . Anxiety   . Depression   . GERD (gastroesophageal reflux disease)   . Hypertension   . Kidney stones 02/26/2017    Patient Active Problem List   Diagnosis Date Noted  . Essential hypertension 02/26/2017  . Kidney stones 02/26/2017  . Overweight 02/26/2017  . Mood disorder ([facility]) 02/26/2017    Past Surgical History:  Procedure Laterality Date  . CERVICAL FUSION    . KNEE ARTHROSCOPY W/ ACL RECONSTRUCTION     right  . LITHOTRIPSY    . thumb surgery         Family History  Problem Relation Age of Onset  . Stroke Mother   . Heart disease Maternal Grandmother   . Alzheimer's disease Maternal Grandfather   . Alcohol  abuse Paternal Grandfather   . COPD Paternal Grandfather     Social History   Tobacco Use  . Smoking status: Former Smoker    Types: Cigarettes    Start date: 09/04/1992    Quit date: 09/04/2000    Years since quitting: 20.1  . Smokeless tobacco: Current User    Types: Snuff  Vaping Use  . Vaping Use: Never used  Substance Use Topics  . Alcohol use: Yes    Comment: socially  . Drug use: No    Home Medications Prior to Admission medications   Medication Sig Start Date End Date Taking? Authorizing Provider  amLODipine (NORVASC) 2.5 MG tablet Take 2.5 mg by mouth daily.    [provider]  azithromycin (ZITHROMAX) 250 MG tablet tad 11/21/17   Eustace Moore, MD  benzonatate (TESSALON) 200 MG capsule Take 1 capsule (200 mg total) by mouth 3 (three) times daily as needed for cough. 11/21/17   Eustace Moore, MD  escitalopram (LEXAPRO) 10 MG tablet Take 1 tablet (10 mg total) by mouth daily. 02/26/17   Eustace Moore, MD  ibuprofen (ADVIL,MOTRIN) 200 MG tablet Take 200 mg by mouth every 6 (six) hours as needed. pain     [provider]  sertraline (ZOLOFT) 25 MG tablet Take 25 mg by mouth daily.    [provider]    Allergies    Ampicillin  Review of Systems  Review of Systems  Constitutional: Positive for activity change. Negative for diaphoresis.  Respiratory: Negative for shortness of breath.   Cardiovascular: Positive for chest pain.  Gastrointestinal: Negative for nausea.  Hematological: Does not bruise/bleed easily.  All other systems reviewed and are negative.   Physical Exam Updated Vital Signs BP (!) 156/94 (BP Location: Left Arm)   Pulse 96   Temp 98.3 F (36.8 C) (Oral)   Resp 19   Ht 5\' 11"  (1.803 m)   Wt 111.1 kg   SpO2 96%   BMI 34.17 kg/m   Physical Exam Vitals and nursing note reviewed.  Constitutional:      Appearance: He is well-developed.  HENT:     Head: Atraumatic.  Cardiovascular:     Rate and Rhythm:  Normal rate.  Pulmonary:     Effort: Pulmonary effort is normal.  Musculoskeletal:     Cervical back: Neck supple.     Right lower leg: No tenderness. No edema.     Left lower leg: No tenderness. No edema.  Skin:    General: Skin is warm.  Neurological:     Mental Status: He is alert and oriented to person, place, and time.     ED Results / Procedures / Treatments   Labs (all labs ordered are listed, but only abnormal results are displayed) Labs Reviewed  BASIC METABOLIC PANEL  CBC  CBG MONITORING, ED  TROPONIN I (HIGH SENSITIVITY)    EKG EKG Interpretation  Date/Time:  Wednesday October 06 2020 10:38:31 EST Ventricular Rate:  91 PR Interval:    QRS Duration: 69 QT Interval:  323 QTC Calculation: 398 R Axis:   -2 Text Interpretation: Sinus rhythm Low voltage, precordial leads Abnormal R-wave progression, early transition Borderline T abnormalities, inferior leads No acute changes No significant change since last tracing Confirmed by 06-01-2006 [phone]) on 10/06/2020 10:41:21 AM   Radiology DG Chest 2 View  Result Date: 10/06/2020 CLINICAL DATA:  Chest pain. EXAM: CHEST - 2 VIEW COMPARISON:  Prior chest radiograph 06/02/2009. FINDINGS: Heart size within normal limits. No appreciable airspace consolidation or pulmonary edema. No evidence of pleural effusion or pneumothorax. No acute bony abnormality identified. Redemonstrated remote left rib fracture deformities. ACDF hardware within the lower cervical spine. IMPRESSION: No evidence of active cardiopulmonary disease. Electronically Signed   By: 06/04/2009 DO   On: 10/06/2020 12:10    Procedures Procedures   Medications Ordered in ED Medications  nitroGLYCERIN (NITROSTAT) SL tablet 0.4 mg (0.4 mg Sublingual Given 10/06/20 1201)  aspirin chewable tablet 324 mg (324 mg Oral Given 10/06/20 1201)    ED Course  I have reviewed the triage vital signs and the nursing notes.  Pertinent labs & imaging results that were  available during my care of the patient were reviewed by me and considered in my medical decision making (see chart for details).    MDM Rules/Calculators/A&P HEAR Score: 52                        50 year old male comes in with chief complaint of chest pain. Chest pain is midsternal moving to the left side and his back.  He has history of GERD, and the central chest pain does feel like GERD, but he took Tums and had no relief.  Normally his pain gets better within minutes of taking Tums, and there is no radiation to the back or left side.  In addition to ACS, which is highest in  our differential diagnosis, we have considered PE, esophagitis, gastritis, esophageal spasm in the differential.  Patient has no pleuritic component, no positional component and does not appear to be consistent with pericarditis on EKG either.  Hear score is 5.  Tropes ordered and we will reassess.  Final Clinical Impression(s) / ED Diagnoses Final diagnoses:  None    Rx / DC Orders ED Discharge Orders    None       Derwood Kaplan, MD 10/06/20 1224

## 2020-10-06 NOTE — Discharge Instructions (Addendum)
We saw you in the ER for the chest pain/shortness of breath. All of our cardiac workup is normal, including labs, EKG and chest X-RAY are normal. We are not sure what is causing your discomfort, but we feel comfortable sending you home at this time. The workup in the ER is not complete, and you should follow up with your primary care doctor for further evaluation.  Please return to the ER if you have worsening chest pain, shortness of breath, pain radiating to your jaw, shoulder, or back, sweats or fainting. Otherwise see the Cardiologist or your primary care doctor as requested.  Start taking daily aspirin.

## 2020-10-06 NOTE — ED Triage Notes (Addendum)
Pt presents to ED via POV for mid chest tightness started this am at 0700 radiated to back of left shoulder, dizziness and headache. Jail Nurse checked BP and was 147/107

## 2020-10-08 NOTE — Progress Notes (Signed)
CARDIOLOGY CONSULT NOTE       Patient ID: Alexander Osborn MRN: [id number] DOB/AGE: 50-22-1972 50 y.o.  Referring Physician: [facility]/ [facility]  Primary Physician: Alexander Stabile, MD Primary Cardiologist: New Reason for Consultation: Chest pain    HPI:  50 y.o. referred by [facility] and Dr Alexander Osborn for chest pain .Seen in ER 10/06/20 History of HTN and obesity. Had heaviness/pressure in chest at rest Associated with nausea No radiation He does have history of GERD and  SSCP had some GI overtones with nausea No relief of pain with TUMS  CXR showed normal mediastinum and NAD. Labs normal including Troponin x 2 ECG was normal SR rate 91 no acute changes   Lots of stess. Alexander Osborn in [facility] [employer] department 18/5 yo daughters Has gained weight and not exercising Has heartburn frequently takes tums Drinks a lot of coffee and dips smokeless tobaco  ROS All other systems reviewed and negative except as noted above  Past Medical History:  Diagnosis Date  . Allergy    ampicillin  . Anxiety   . Depression   . GERD (gastroesophageal reflux disease)   . Hypertension   . Kidney stones 02/26/2017    Family History  Problem Relation Age of Onset  . Stroke Mother   . Heart disease Maternal Grandmother   . Alzheimer's disease Maternal Grandfather   . Alcohol abuse Paternal Grandfather   . COPD Paternal Grandfather     Social History   Socioeconomic History  . Marital status: Married    Spouse name: Alexander Osborn  . Number of children: 2  . Years of education: 33  . Highest education level: Not on file  Occupational History  . Occupation: Archivist, Economist, Patent examiner    Comment: [employer]  Tobacco Use  . Smoking status: Former Smoker    Types: Cigarettes    Start date: 09/04/1992    Quit date: 09/04/2000    Years since quitting: 20.1  . Smokeless tobacco: Current User    Types: Snuff  Vaping Use  . Vaping Use: Never used  Substance and Sexual Activity  . Alcohol use: Yes    Comment:  socially  . Drug use: No  . Sexual activity: Yes    Birth control/protection: None  Other Topics Concern  . Not on file  Social History Narrative   Married   Two daughters   Patent examiner   Social Determinants of Health   Financial Resource Strain: Not on file  Food Insecurity: Not on file  Transportation Needs: Not on file  Physical Activity: Not on file  Stress: Not on file  Social Connections: Not on file  Intimate Partner Violence: Not on file    Past Surgical History:  Procedure Laterality Date  . CERVICAL FUSION    . KNEE ARTHROSCOPY W/ ACL RECONSTRUCTION     right  . LITHOTRIPSY    . thumb surgery        Current Outpatient Medications:  .  ASCORBIC ACID PO, Take 1 tablet by mouth daily., Disp: , Rfl:  .  calcium carbonate (TUMS - DOSED IN MG ELEMENTAL CALCIUM) 500 MG chewable tablet, Chew 2 tablets by mouth daily., Disp: , Rfl:  .  ibuprofen (ADVIL,MOTRIN) 200 MG tablet, Take 200 mg by mouth every 6 (six) hours as needed. pain, Disp: , Rfl:  .  losartan (COZAAR) 25 MG tablet, Take 1 tablet (25 mg total) by mouth daily., Disp: 90 tablet, Rfl: 3 .  metoprolol tartrate (LOPRESSOR) 100  MG tablet, Take 1 tablet (100 mg total) by mouth as directed. Take 2 Hours prior to CT Scan, Disp: 1 tablet, Rfl: 0 .  Multiple Vitamins-Minerals (ZINC PO), Take 1 tablet by mouth daily., Disp: , Rfl:     Physical Exam: Blood pressure (!) 158/100, pulse 76, height 5\' 11"  (1.803 m), weight 117.3 kg, SpO2 95 %.   Affect appropriate Healthy:  appears stated age HEENT: normal Neck supple with no adenopathy JVP normal no bruits no thyromegaly Lungs clear with no wheezing and good diaphragmatic motion Heart:  S1/S2 no murmur, no rub, gallop or click PMI normal Abdomen: benighn, BS positve, no tenderness, no AAA no bruit.  No HSM or HJR Distal pulses intact with no bruits No edema Neuro non-focal Skin warm and dry No muscular weakness   Labs:   Lab Results  Component Value  Date   WBC 5.5 10/06/2020   HGB 15.7 10/06/2020   HCT 47.7 10/06/2020   MCV 90.0 10/06/2020   PLT 178 10/06/2020    No results for input(s): NA, K, CL, CO2, BUN, CREATININE, CALCIUM, PROT, BILITOT, ALKPHOS, ALT, AST, GLUCOSE in the last 168 hours.  Invalid input(s): LABALBU No results found for: CKTOTAL, CKMB, CKMBINDEX, TROPONINI No results found for: CHOL No results found for: HDL No results found for: LDLCALC No results found for: TRIG No results found for: CHOLHDL No results found for: LDLDIRECT    Radiology: DG Chest 2 View  Result Date: 10/06/2020 CLINICAL DATA:  Chest pain. EXAM: CHEST - 2 VIEW COMPARISON:  Prior chest radiograph 06/02/2009. FINDINGS: Heart size within normal limits. No appreciable airspace consolidation or pulmonary edema. No evidence of pleural effusion or pneumothorax. No acute bony abnormality identified. Redemonstrated remote left rib fracture deformities. ACDF hardware within the lower cervical spine. IMPRESSION: No evidence of active cardiopulmonary disease. Electronically Signed   By: 06/04/2009 DO   On: 10/06/2020 12:10    EKG: NSR rate 91 normal    ASSESSMENT AND PLAN:  1. Chest Pain: atypical being non exertional and normal ECG R/O in ER  Favor cardiac CTA to risk stratify And also exclude other etiologies of pain in chest area. Will need Lopressor 100 mg 2 hours before study No contrast allergy and normal renal function   2. BP:  Start Cozaar 25 mg daily f/u Dr 12/04/2020 BMET see above   3. GERD:  Low carb diet suggested primary give him protonix and not just using TUMS   F/U PRN if CT normal   Signed: Margo Osborn 10/18/2020, 10:01 AM

## 2020-10-18 ENCOUNTER — Ambulatory Visit (INDEPENDENT_AMBULATORY_CARE_PROVIDER_SITE_OTHER): Payer: Commercial Managed Care - PPO | Admitting: Cardiovascular Disease

## 2020-10-18 ENCOUNTER — Other Ambulatory Visit (HOSPITAL_COMMUNITY)
Admission: RE | Admit: 2020-10-18 | Discharge: 2020-10-18 | Disposition: A | Discharge status: Home / Self Care | Payer: Commercial Managed Care - PPO | Source: Ambulatory Visit | Attending: Cardiovascular Disease | Admitting: Cardiovascular Disease

## 2020-10-18 ENCOUNTER — Other Ambulatory Visit: Payer: Self-pay

## 2020-10-18 ENCOUNTER — Encounter: Payer: Self-pay | Admitting: Cardiovascular Disease

## 2020-10-18 VITALS — BP 158/100 | HR 76 | Ht 71.0 in | Wt 258.6 lb

## 2020-10-18 DIAGNOSIS — R079 Chest pain, unspecified: Secondary | ICD-10-CM | POA: Diagnosis not present

## 2020-10-18 DIAGNOSIS — Z79899 Other long term (current) drug therapy: Secondary | ICD-10-CM | POA: Diagnosis not present

## 2020-10-18 LAB — BASIC METABOLIC PANEL
Anion gap: 7 (ref 5–15)
BUN: 18 mg/dL (ref 6–20)
CO2: 23 mmol/L (ref 22–32)
Calcium: 9.2 mg/dL (ref 8.9–10.3)
Chloride: 108 mmol/L (ref 98–111)
Creatinine, Ser: 1.21 mg/dL (ref 0.61–1.24)
GFR, Estimated: >60 mL/min (ref >60)
Glucose, Bld: 101 mg/dL — ABNORMAL HIGH (ref 70–99)
Potassium: 4.2 mmol/L (ref 3.5–5.1)
Sodium: 138 mmol/L (ref 135–145)

## 2020-10-18 MED ORDER — METOPROLOL TARTRATE 100 MG PO TABS: 100.0000 mg | ORAL_TABLET | ORAL | 0 refills | Status: DC

## 2020-10-18 MED ORDER — LOSARTAN POTASSIUM 25 MG PO TABS: 25.0000 mg | ORAL_TABLET | Freq: Every day | ORAL | 3 refills | Status: DC

## 2020-10-18 NOTE — Patient Instructions (Signed)
Medication Instructions:  Your physician recommends that you continue on your current medications as directed. Please refer to the Current Medication list given to you today.  Take Lopressor 100 mg 2 hours prior to CT Scan   *If you need a refill on your cardiac medications before your next appointment, please call your pharmacy*   Lab Work:  Your physician recommends that you return for lab work in: BMET  Today   If you have labs (blood work) drawn today and your tests are completely normal, you will receive your results only by: Marland Kitchen MyChart Message (if you have MyChart) OR . A paper copy in the mail If you have any lab test that is abnormal or we need to change your treatment, we will call you to review the results.   Testing/Procedures:  Cardiac CT Scan    Follow-Up: At [facility], you and your health needs are our priority.  As part of our continuing mission to provide you with exceptional heart care, we have created designated Provider Care Teams.  These Care Teams include your primary Cardiologist (physician) and Advanced Practice Providers (APPs -  Physician Assistants and Nurse Practitioners) who all work together to provide you with the care you need, when you need it.  We recommend signing up for the patient portal called "MyChart".  Sign up information is provided on this After Visit Summary.  MyChart is used to connect with patients for Virtual Visits (Telemedicine).  Patients are able to view lab/test results, encounter notes, upcoming appointments, etc.  Non-urgent messages can be sent to your provider as well.   To learn more about what you can do with MyChart, go to [email or web address].    Your next appointment:    As Needed   The format for your next appointment:   In Person  Provider:   Charlton Haws, MD   Other Instructions Thank you for choosing [facility]!  Your cardiac CT will be scheduled at one of the below locations:   [facility] [address] [place], [place] [address] [phone]  OR  [facility] [address] [place], [place] [address] [phone]  If scheduled at [facility], please arrive at the [facility] main entrance (entrance A) of [facility] 30 minutes prior to test start time. Proceed to the [facility] Radiology Department (first floor) to check-in and test prep.  If scheduled at [facility], please arrive 15 mins early for check-in and test prep.  Please follow these instructions carefully (unless otherwise directed):  Hold all erectile dysfunction medications at least 3 days (72 hrs) prior to test.  On the Night Before the Test: . Be sure to Drink plenty of water. . Do not consume any caffeinated/decaffeinated beverages or chocolate 12 hours prior to your test. . Do not take any antihistamines 12 hours prior to your test.  On the Day of the Test: . Drink plenty of water until 1 hour prior to the test. . Do not eat any food 4 hours prior to the test. . You may take your regular medications prior to the test.  . Take metoprolol (Lopressor) two hours prior to test. . HOLD Furosemide/Hydrochlorothiazide morning of the test.   *For Clinical Staff only. Please instruct patient the following:*        -Drink plenty of water       -Hold Furosemide/hydrochlorothiazide morning of the test       -  Take metoprolol (Lopressor) 2 hours prior to test (if applicable).                  -If HR is less than 55 BPM- No Lopressor                -IF HR is greater than 55 BPM and patient is less than or equal to 50 yrs old Lopressor 100mg  x1.                -If HR is greater than 55 BPM and patient is greater than 57 yrs old Lopressor 50 mg x1.     Do not give Lopressor to patients with an allergy to lopressor or anyone with asthma or active COPD symptoms (currently taking steroids).       After the  Test: . Drink plenty of water. . After receiving IV contrast, you may experience a mild flushed feeling. This is normal. . On occasion, you may experience a mild rash up to 24 hours after the test. This is not dangerous. If this occurs, you can take Benadryl 25 mg and increase your fluid intake. . If you experience trouble breathing, this can be serious. If it is severe call 911 IMMEDIATELY. If it is mild, please call our office. . If you take any of these medications: Glipizide/Metformin, Avandament, Glucavance, please do not take 48 hours after completing test unless otherwise instructed.   Once we have confirmed authorization from your insurance company, we will call you to set up a date and time for your test. Based on how quickly your insurance processes prior authorizations requests, please allow up to 4 weeks to be contacted for scheduling your Cardiac CT appointment. Be advised that routine Cardiac CT appointments could be scheduled as many as 8 weeks after your provider has ordered it.  For non-scheduling related questions, please contact the cardiac imaging nurse navigator should you have any questions/concerns: 61, Cardiac Imaging Nurse Navigator Rockwell Alexandria, Cardiac Imaging Nurse Navigator [facility] Direct Office Dial: [phone]   For scheduling needs, including cancellations and rescheduling, please call [phone], [phone].

## 2020-11-01 ENCOUNTER — Telehealth (HOSPITAL_COMMUNITY): Payer: Self-pay | Admitting: Emergency Medicine

## 2020-11-01 NOTE — Telephone Encounter (Signed)
Reaching out to patient to offer assistance regarding upcoming cardiac imaging study; pt verbalizes understanding of appt date/time, parking situation and where to check in, pre-test NPO status and medications ordered, and verified current allergies; name and call back number provided for further questions should they arise Rockwell Alexandria RN Navigator Cardiac Imaging [facility] [phone] office [phone] cell  Pt taking 100mg  metoprolol 2h PTA 

## 2020-11-02 ENCOUNTER — Other Ambulatory Visit: Payer: Self-pay

## 2020-11-02 ENCOUNTER — Ambulatory Visit (HOSPITAL_COMMUNITY)
Admission: RE | Admit: 2020-11-02 | Discharge: 2020-11-02 | Disposition: A | Discharge status: Home / Self Care | Payer: Commercial Managed Care - PPO | Source: Ambulatory Visit | Attending: Cardiovascular Disease | Admitting: Cardiovascular Disease

## 2020-11-02 ENCOUNTER — Telehealth: Payer: Self-pay

## 2020-11-02 ENCOUNTER — Encounter (HOSPITAL_COMMUNITY): Payer: Self-pay

## 2020-11-02 DIAGNOSIS — R079 Chest pain, unspecified: Secondary | ICD-10-CM

## 2020-11-02 DIAGNOSIS — I2510 Atherosclerotic heart disease of native coronary artery without angina pectoris: Secondary | ICD-10-CM

## 2020-11-02 MED ORDER — IOHEXOL 350 MG/ML SOLN
100.0000 mL | Freq: Once | INTRAVENOUS | Status: AC | PRN
  Administered 2020-11-02: 100 mL via INTRAVENOUS

## 2020-11-02 MED ORDER — NITROGLYCERIN 0.4 MG SL SUBL
0.8000 mg | SUBLINGUAL_TABLET | Freq: Once | SUBLINGUAL | Status: AC
  Administered 2020-11-02: 0.8 mg via SUBLINGUAL

## 2020-11-02 MED ORDER — ASPIRIN EC 81 MG PO TBEC: 81.0000 mg | DELAYED_RELEASE_TABLET | Freq: Every day | ORAL | 3 refills | Status: AC

## 2020-11-02 MED ORDER — NITROGLYCERIN 0.4 MG SL SUBL
SUBLINGUAL_TABLET | SUBLINGUAL | Status: AC
  Filled 2020-11-02: qty 2

## 2020-11-02 NOTE — Telephone Encounter (Signed)
I spoke with patient.He will start ASA 81 mg daily and have fasting labs done at [facility] out-patient lab.Follow up with Dr.Nishan is 02/23/21 at 0930

## 2020-11-02 NOTE — Telephone Encounter (Signed)
-----   Message from Wendall Stade, MD sent at 11/02/2020 12:50 PM EST ----- Has CAD but not obstructive high calcium score for age Started him on BP med last visit Needs lipid and liver and start statin if LDL >100 start ASA 81 mg Can f/u with me in 3 months

## 2020-11-04 ENCOUNTER — Other Ambulatory Visit (HOSPITAL_COMMUNITY)
Admission: RE | Admit: 2020-11-04 | Discharge: 2020-11-04 | Disposition: A | Discharge status: Home / Self Care | Payer: Commercial Managed Care - PPO | Source: Ambulatory Visit | Attending: Cardiovascular Disease | Admitting: Cardiovascular Disease

## 2020-11-04 ENCOUNTER — Other Ambulatory Visit: Payer: Self-pay

## 2020-11-04 ENCOUNTER — Telehealth: Payer: Self-pay

## 2020-11-04 DIAGNOSIS — I2510 Atherosclerotic heart disease of native coronary artery without angina pectoris: Secondary | ICD-10-CM | POA: Diagnosis not present

## 2020-11-04 DIAGNOSIS — Z79899 Other long term (current) drug therapy: Secondary | ICD-10-CM

## 2020-11-04 LAB — HEPATIC FUNCTION PANEL
ALT: 73 U/L — ABNORMAL HIGH (ref 0–44)
AST: 38 U/L (ref 15–41)
Albumin: 4.2 g/dL (ref 3.5–5.0)
Alkaline Phosphatase: 53 U/L (ref 38–126)
Bilirubin, Direct: 0.2 mg/dL (ref 0.0–0.2)
Indirect Bilirubin: 0.7 mg/dL (ref 0.3–0.9)
Total Bilirubin: 0.9 mg/dL (ref 0.3–1.2)
Total Protein: 7.4 g/dL (ref 6.5–8.1)

## 2020-11-04 LAB — LIPID PANEL
Cholesterol: 172 mg/dL (ref 0–200)
HDL: 46 mg/dL (ref >40)
LDL Cholesterol: 115 mg/dL — ABNORMAL HIGH (ref 0–99)
Total CHOL/HDL Ratio: 3.7 RATIO
Triglycerides: 56 mg/dL (ref <150)
VLDL: 11 mg/dL (ref 0–40)

## 2020-11-04 MED ORDER — ROSUVASTATIN CALCIUM 5 MG PO TABS: 5.0000 mg | ORAL_TABLET | Freq: Every day | ORAL | 3 refills | Status: DC

## 2020-11-04 NOTE — Telephone Encounter (Signed)
The patient has been notified of the result and verbalized understanding.  All questions (if any) were answered. Cindi Carbon Hatley, RN 11/04/2020 2:13 PM   Sent medication to patient's pharmacy of choice and patient will go to [facility] for repeat lab work in 3 months.

## 2020-11-04 NOTE — Telephone Encounter (Signed)
-----   Message from Wendall Stade, MD sent at 11/04/2020 11:30 AM EST ----- LDL > 100 start crestor 5 mg daily and f/u labs in 3 months

## 2021-02-17 NOTE — Progress Notes (Signed)
CARDIOLOGY CONSULT NOTE       Patient ID: Alexander Osborn MRN: [id number] DOB/AGE: 1970/09/14 50 y.o.  Referring Physician: [facility]/ [facility]  Primary Physician: Benita Stabile, MD Primary Cardiologist: New Reason for Consultation: Chest pain    HPI:  50 y.o. referred by [facility] and Dr Margo Aye for chest pain on 10/18/20  .Seen in ER 10/06/20 History of HTN and obesity. Had heaviness/pressure in chest at rest Associated with nausea No radiation He does have history of GERD and  SSCP had some GI overtones with nausea No relief of pain with TUMS  CXR showed normal mediastinum and NAD. Labs normal including Troponin x 2 ECG was normal SR rate 91 no acute changes   Lots of stess. 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 Discussed getting a home BP cuff     ROS All other systems reviewed and negative except as noted above  Past Medical History:  Diagnosis Date   Allergy    ampicillin   Anxiety    Depression    GERD (gastroesophageal reflux disease)    Hypertension    Kidney stones 02/26/2017    Family History  Problem Relation Age of Onset   Stroke Mother    Heart disease Maternal Grandmother    Alzheimer's disease Maternal Grandfather    Alcohol abuse Paternal Grandfather    COPD Paternal Grandfather     Social History   Socioeconomic History   Marital status: Married    Spouse name: Paige   Number of children: 2   Years of education: 14   Highest education level: Not on file  Occupational History   Occupation: Archivist, Economist, Patent examiner    Comment: [employer]  Tobacco Use   Smoking status: Former     Pack years: 0.00    Types: Cigarettes    Start date: 09/04/1992    Quit date: 09/04/2000    Years since quitting: 20.4   Smokeless tobacco: Current    Types: Snuff  Vaping Use   Vaping Use: Never used  Substance and Sexual Activity   Alcohol use: Yes    Comment: socially   Drug use: No   Sexual activity: Yes    Birth control/protection: None  Other Topics Concern   Not on file  Social History Narrative   Married   Two daughters   Patent examiner   Social Determinants of Health   Financial Resource Strain: Not on file  Food Insecurity: Not on file  Transportation Needs: Not on file  Physical Activity: Not on file  Stress: Not on file  Social Connections: Not on file  Intimate Partner Violence: Not on file    Past Surgical History:  Procedure Laterality Date   CERVICAL FUSION     KNEE ARTHROSCOPY W/ ACL RECONSTRUCTION     right   LITHOTRIPSY     thumb surgery        Current Outpatient Medications:    ASCORBIC ACID PO, Take 1 tablet by mouth daily., Disp: , Rfl:    aspirin EC 81 MG tablet, Take 1 tablet (81 mg total) by mouth daily. Swallow  whole., Disp: 90 tablet, Rfl: 3   calcium carbonate (TUMS - DOSED IN MG ELEMENTAL CALCIUM) 500 MG chewable tablet, Chew 2 tablets by mouth daily., Disp: , Rfl:    ibuprofen (ADVIL,MOTRIN) 200 MG tablet, Take 200 mg by mouth every 6 (six) hours as needed. pain, Disp: , Rfl:    losartan (COZAAR) 25 MG tablet, Take 1 tablet (25 mg total) by mouth daily., Disp: 90 tablet, Rfl: 3   Multiple Vitamins-Minerals (ZINC PO), Take 1 tablet by mouth daily., Disp: , Rfl:    rosuvastatin (CRESTOR) 5 MG tablet, Take 1 tablet (5 mg total) by mouth daily., Disp: 90 tablet, Rfl: 3   testosterone cypionate (DEPOTESTOSTERONE CYPIONATE) 200 MG/ML injection, Inject 200 mg into the muscle every 14 (fourteen) days., Disp: , Rfl:     Physical Exam: Blood pressure (!) 152/90, pulse 76, height 5\' 11"  (1.803 m), SpO2 95 %.   Affect appropriate Healthy:   appears stated age HEENT: normal Neck supple with no adenopathy JVP normal no bruits no thyromegaly Lungs clear with no wheezing and good diaphragmatic motion Heart:  S1/S2 no murmur, no rub, gallop or click PMI normal Abdomen: benighn, BS positve, no tenderness, no AAA no bruit.  No HSM or HJR Distal pulses intact with no bruits No edema Neuro non-focal Skin warm and dry No muscular weakness   Labs:   Lab Results  Component Value Date   WBC 5.5 10/06/2020   HGB 15.7 10/06/2020   HCT 47.7 10/06/2020   MCV 90.0 10/06/2020   PLT 178 10/06/2020    No results for input(s): NA, K, CL, CO2, BUN, CREATININE, CALCIUM, PROT, BILITOT, ALKPHOS, ALT, AST, GLUCOSE in the last 168 hours.  Invalid input(s): LABALBU No results found for: CKTOTAL, CKMB, CKMBINDEX, TROPONINI  Lab Results  Component Value Date   CHOL 172 11/04/2020   Lab Results  Component Value Date   HDL 46 11/04/2020   Lab Results  Component Value Date   LDLCALC 115 (H) 11/04/2020   Lab Results  Component Value Date   TRIG 56 11/04/2020   Lab Results  Component Value Date   CHOLHDL 3.7 11/04/2020   No results found for: LDLDIRECT    Radiology: No results found.   EKG: NSR rate 91 normal    ASSESSMENT AND PLAN:  1. Chest Pain: atypical being non exertional and normal ECG R/O in ER  Cardiac CTA March 2022 1-24% LAD/RCA disease medical Rx   2. BP:  Increase Cozaar to 50 mg daily Low sodium diet Weight loss   3. GERD:  Low carb diet suggested primary give him protonix and not just using TUMS   4. HLD:  started on crestor 11/04/20 LDL 115 will try and get recent labs done by insurance company   Increase Cozaar 50 mg daily    F/U in  3 months   Signed: 01/04/21 02/23/2021, 9:35 AM

## 2021-02-23 ENCOUNTER — Ambulatory Visit (INDEPENDENT_AMBULATORY_CARE_PROVIDER_SITE_OTHER): Payer: Commercial Managed Care - PPO | Admitting: Cardiovascular Disease

## 2021-02-23 ENCOUNTER — Other Ambulatory Visit: Payer: Self-pay

## 2021-02-23 ENCOUNTER — Encounter: Payer: Self-pay | Admitting: Cardiovascular Disease

## 2021-02-23 ENCOUNTER — Ambulatory Visit: Payer: Commercial Managed Care - PPO | Admitting: Cardiovascular Disease

## 2021-02-23 VITALS — BP 152/90 | HR 76 | Ht 71.0 in

## 2021-02-23 DIAGNOSIS — I2510 Atherosclerotic heart disease of native coronary artery without angina pectoris: Secondary | ICD-10-CM

## 2021-02-23 DIAGNOSIS — E782 Mixed hyperlipidemia: Secondary | ICD-10-CM | POA: Diagnosis not present

## 2021-02-23 DIAGNOSIS — I10 Essential (primary) hypertension: Secondary | ICD-10-CM

## 2021-02-23 MED ORDER — LOSARTAN POTASSIUM 50 MG PO TABS: 50.0000 mg | ORAL_TABLET | Freq: Every day | ORAL | 3 refills | Status: AC

## 2021-02-23 NOTE — Patient Instructions (Signed)
Medication Instructions:  Your physician has recommended you make the following change in your medication:   Increase Losartan (Cozaar) to 50 mg Daily   *If you need a refill on your cardiac medications before your next appointment, please call your pharmacy*   Lab Work: NONE   If you have labs (blood work) drawn today and your tests are completely normal, you will receive your results only by: MyChart Message (if you have MyChart) OR A paper copy in the mail If you have any lab test that is abnormal or we need to change your treatment, we will call you to review the results.   Testing/Procedures: NONE    Follow-Up: At [facility], you and your health needs are our priority.  As part of our continuing mission to provide you with exceptional heart care, we have created designated Provider Care Teams.  These Care Teams include your primary Cardiologist (physician) and Advanced Practice Providers (APPs -  Physician Assistants and Nurse Practitioners) who all work together to provide you with the care you need, when you need it.  We recommend signing up for the patient portal called "MyChart".  Sign up information is provided on this After Visit Summary.  MyChart is used to connect with patients for Virtual Visits (Telemedicine).  Patients are able to view lab/test results, encounter notes, upcoming appointments, etc.  Non-urgent messages can be sent to your provider as well.   To learn more about what you can do with MyChart, go to [email or web address].    Your next appointment:   3 month(s)  The format for your next appointment:   In Person  Provider:   Charlton Haws, MD   Other Instructions Thank you for choosing [facility]!

## 2021-03-03 ENCOUNTER — Telehealth: Payer: Self-pay | Admitting: Cardiovascular Disease

## 2021-03-03 NOTE — Telephone Encounter (Signed)
Patient states the last time he saw Dr. Eden Emms, he increased his Losartan (COZAAR) to a 50 MG tablet. He has been having some issues and wants to know if the Med increase would cause him to real bad headaches, no to feel well and interrupt his sleep. He said he has been waking up around 4am for the last 4 days or so. Please call him #[phone].

## 2021-03-03 NOTE — Telephone Encounter (Signed)
I spoke with patient. He has losartan dose increase from 25 mg to 50 mg 8 days ago. He reports headache on right side of his head intermittently for several days. He has not used any OTC pain relievers. He says he monitors BP at home and has noted decrease in BP with med increase. He is going to take tylenol pm as directed if he has a headache when he gets ready for bed. I told him it is unlikely medication increase is the cause of his headaches. He will try tylenol.

## 2021-05-26 NOTE — Progress Notes (Signed)
CARDIOLOGY CONSULT NOTE       Patient ID: Alexander Osborn MRN: [id number] DOB/AGE: [birth year] 50 y.o.  Referring Physician: [facility]/ [facility]  Primary Physician: Benita Stabile, MD Primary Cardiologist: New Reason for Consultation: Chest pain    HPI:  50 y.o. referred by [facility] and Dr Margo Aye for chest pain on 10/18/20  .Seen in ER 10/06/20 History of HTN and obesity. Had heaviness/pressure in chest at rest Associated with nausea No radiation He does have history of GERD and  SSCP had some GI overtones with nausea No relief of pain with TUMS  CXR showed normal mediastinum and NAD. Labs normal including Troponin x 2 ECG was normal SR rate 91 no acute changes   Lots of stess. 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 Had a headache a week after increase but not thought to be From ARB  BP ok at home repeat for me 150/90 mmhg Discussed weight loss and exercise He does try to get on peleton 3x/week     ROS All other systems reviewed and negative except as noted above  Past Medical History:  Diagnosis Date   Allergy    ampicillin   Anxiety    Depression    GERD (gastroesophageal reflux disease)    Hypertension    Kidney stones 02/26/2017    Family History  Problem Relation Age of Onset   Stroke Mother    Heart disease Maternal Grandmother    Alzheimer's disease Maternal Grandfather    Alcohol abuse Paternal Grandfather    COPD Paternal Grandfather     Social History   Socioeconomic History   Marital  status: Married    Spouse name: Paige   Number of children: 2   Years of education: 14   Highest education level: Not on file  Occupational History   Occupation: Archivist, Economist, Patent examiner    Comment: [employer]  Tobacco Use   Smoking status: Former    Types: Cigarettes    Start date: 09/04/1992    Quit date: 09/04/2000    Years since quitting: 20.7   Smokeless tobacco: Current    Types: Snuff  Vaping Use   Vaping Use: Never used  Substance and Sexual Activity   Alcohol use: Yes    Comment: socially   Drug use: No   Sexual activity: Yes    Birth control/protection: None  Other Topics Concern   Not on file  Social History Narrative   Married   Two daughters   Patent examiner   Social Determinants of Health   Financial Resource Strain: Not on file  Food Insecurity: Not on file  Transportation Needs: Not on file  Physical Activity: Not on file  Stress: Not on file  Social Connections: Not on file  Intimate Partner Violence: Not on file    Past Surgical History:  Procedure Laterality Date   CERVICAL FUSION     KNEE ARTHROSCOPY W/ ACL RECONSTRUCTION  right   LITHOTRIPSY     thumb surgery        Current Outpatient Medications:    ASCORBIC ACID PO, Take 1 tablet by mouth daily., Disp: , Rfl:    aspirin EC 81 MG tablet, Take 1 tablet (81 mg total) by mouth daily. Swallow whole., Disp: 90 tablet, Rfl: 3   calcium carbonate (TUMS - DOSED IN MG ELEMENTAL CALCIUM) 500 MG chewable tablet, Chew 2 tablets by mouth daily., Disp: , Rfl:    ibuprofen (ADVIL,MOTRIN) 200 MG tablet, Take 200 mg by mouth every 6 (six) hours as needed. pain, Disp: , Rfl:    Multiple Vitamins-Minerals (ZINC PO), Take 1 tablet by mouth daily., Disp: , Rfl:    rosuvastatin (CRESTOR) 5 MG tablet, Take 1 tablet (5 mg total) by mouth daily., Disp: 90 tablet, Rfl: 3   testosterone cypionate (DEPOTESTOSTERONE CYPIONATE) 200 MG/ML injection, Inject 200 mg into the muscle every 14  (fourteen) days., Disp: , Rfl:    losartan (COZAAR) 50 MG tablet, Take 1 tablet (50 mg total) by mouth daily., Disp: 90 tablet, Rfl: 3    Physical Exam: Blood pressure (!) 174/110, pulse 86, height 5\' 11"  (1.803 m), weight 117 kg, SpO2 96 %.   Affect appropriate Healthy:  appears stated age HEENT: normal Neck supple with no adenopathy JVP normal no bruits no thyromegaly Lungs clear with no wheezing and good diaphragmatic motion Heart:  S1/S2 no murmur, no rub, gallop or click PMI normal Abdomen: benighn, BS positve, no tenderness, no AAA no bruit.  No HSM or HJR Distal pulses intact with no bruits No edema Neuro non-focal Skin warm and dry No muscular weakness   Labs:   Lab Results  Component Value Date   WBC 5.5 10/06/2020   HGB 15.7 10/06/2020   HCT 47.7 10/06/2020   MCV 90.0 10/06/2020   PLT 178 10/06/2020    No results for input(s): NA, K, CL, CO2, BUN, CREATININE, CALCIUM, PROT, BILITOT, ALKPHOS, ALT, AST, GLUCOSE in the last 168 hours.  Invalid input(s): LABALBU No results found for: CKTOTAL, CKMB, CKMBINDEX, TROPONINI  Lab Results  Component Value Date   CHOL 172 11/04/2020   Lab Results  Component Value Date   HDL 46 11/04/2020   Lab Results  Component Value Date   LDLCALC 115 (H) 11/04/2020   Lab Results  Component Value Date   TRIG 56 11/04/2020   Lab Results  Component Value Date   CHOLHDL 3.7 11/04/2020   No results found for: LDLDIRECT    Radiology: No results found.   EKG: NSR rate 91 normal    ASSESSMENT AND PLAN:  1. Chest Pain: atypical being non exertional and normal ECG R/O in ER  Cardiac CTA March 2022 1-24% LAD/RCA disease medical Rx   2. BP: Better with Cozaar to 50 mg daily Low sodium diet Weight loss White coat component   3. GERD:  Low carb diet suggested primary give him protonix and not just using TUMS   4. HLD:  started on crestor 11/04/20 LDL 115 will try and get recent labs done by insurance company   Lipid and  liver in 6 months    F/U in  6 months   Signed: 01/04/21 05/30/2021, 8:57 AM

## 2021-05-30 ENCOUNTER — Encounter: Payer: Self-pay | Admitting: Cardiovascular Disease

## 2021-05-30 ENCOUNTER — Ambulatory Visit (INDEPENDENT_AMBULATORY_CARE_PROVIDER_SITE_OTHER): Payer: Commercial Managed Care - PPO | Admitting: Cardiovascular Disease

## 2021-05-30 ENCOUNTER — Other Ambulatory Visit: Payer: Self-pay

## 2021-05-30 VITALS — BP 174/110 | HR 86 | Ht 71.0 in | Wt 258.0 lb

## 2021-05-30 DIAGNOSIS — I10 Essential (primary) hypertension: Secondary | ICD-10-CM | POA: Diagnosis not present

## 2021-05-30 DIAGNOSIS — I2510 Atherosclerotic heart disease of native coronary artery without angina pectoris: Secondary | ICD-10-CM | POA: Diagnosis not present

## 2021-05-30 DIAGNOSIS — E782 Mixed hyperlipidemia: Secondary | ICD-10-CM

## 2021-05-30 NOTE — Patient Instructions (Signed)
Medication Instructions:  Your physician recommends that you continue on your current medications as directed. Please refer to the Current Medication list given to you today.  *If you need a refill on your cardiac medications before your next appointment, please call your pharmacy*   Lab Work: NONE  If you have labs (blood work) drawn today and your tests are completely normal, you will receive your results only by: MyChart Message (if you have MyChart) OR A paper copy in the mail If you have any lab test that is abnormal or we need to change your treatment, we will call you to review the results.   Testing/Procedures: NONE    Follow-Up: At [facility], you and your health needs are our priority.  As part of our continuing mission to provide you with exceptional heart care, we have created designated Provider Care Teams.  These Care Teams include your primary Cardiologist (physician) and Advanced Practice Providers (APPs -  Physician Assistants and Nurse Practitioners) who all work together to provide you with the care you need, when you need it.  We recommend signing up for the patient portal called "MyChart".  Sign up information is provided on this After Visit Summary.  MyChart is used to connect with patients for Virtual Visits (Telemedicine).  Patients are able to view lab/test results, encounter notes, upcoming appointments, etc.  Non-urgent messages can be sent to your provider as well.   To learn more about what you can do with MyChart, go to [email or web address].    Your next appointment:   6 month(s)  The format for your next appointment:   In Person  Provider:   Peter Nishan, MD   Other Instructions Thank you for choosing [facility]!    

## 2021-07-26 ENCOUNTER — Encounter (INDEPENDENT_AMBULATORY_CARE_PROVIDER_SITE_OTHER): Payer: Self-pay | Admitting: *Deleted

## 2022-01-02 ENCOUNTER — Encounter (INDEPENDENT_AMBULATORY_CARE_PROVIDER_SITE_OTHER): Payer: Self-pay | Admitting: *Deleted

## 2022-01-31 ENCOUNTER — Other Ambulatory Visit: Payer: Self-pay | Admitting: Cardiovascular Disease

## 2022-03-03 ENCOUNTER — Other Ambulatory Visit: Payer: Self-pay | Admitting: Cardiovascular Disease

## 2022-04-07 ENCOUNTER — Other Ambulatory Visit: Payer: Self-pay | Admitting: Cardiovascular Disease

## 2022-04-12 LAB — EXTERNAL GENERIC LAB PROCEDURE: COLOGUARD: NEGATIVE

## 2022-04-12 LAB — COLOGUARD: COLOGUARD: NEGATIVE

## 2022-07-28 IMAGING — CT CT HEART MORP W/ CTA COR W/ SCORE W/ CA W/CM &/OR W/O CM
4 of 7 series · 8 of 20 positions shown, 9 images · IV contrast (APPLIED)
Comparison: Chest CT 06/01/2009.
COMPARISON: Chest CT 06/01/2009.

Addendum:
EXAM:
OVER-READ INTERPRETATION  CT CHEST

The following report is an over-read performed by radiologist Dr.
Brendon Jim [REDACTED] on 11/02/2020. This
over-read does not include interpretation of cardiac or coronary
anatomy or pathology. The coronary calcium score/coronary CTA
interpretation by the cardiologist is attached.
CLINICAL DATA: Chest pain
Cardiac CTA
MEDICATIONS:
Sub lingual nitro. 4 mg and lopressor 100mg oral
TECHNIQUE: The patient was scanned on a Siemens Force 192 scanner. Gantry
rotation speed was 250 msecs. Collimation was. 6 mm . A 120 kV
prospective scan was triggered in the ascending thoracic aorta at
140 HU's with full mA between 30-70% of the R-R interval . Average
HR during the scan was bpm. The 3D data set was interpreted on a
dedicated work station using MPR, MIP and VRT modes. A total of 80
cc of contrast was used.

[Series 6: best diast 72 % · axial · 0.39mm/px · z∈[+1328,+1366]mm · 2 of 283 slices shown, 3 images]
[im 95/283  vessel]
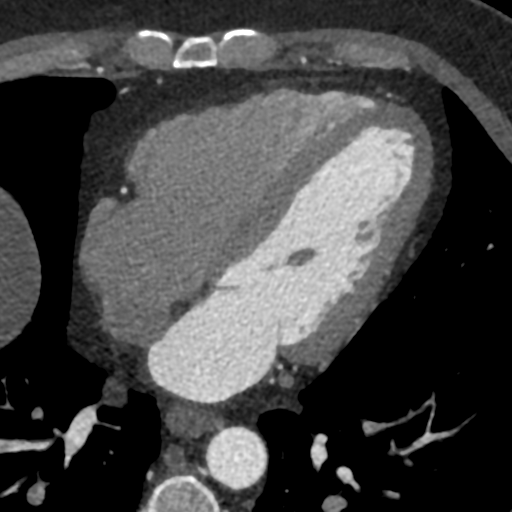
[im 95/283  lung]
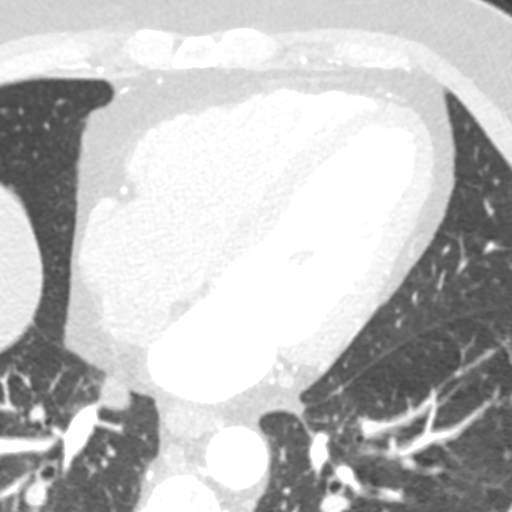
[im 189/283  vessel]
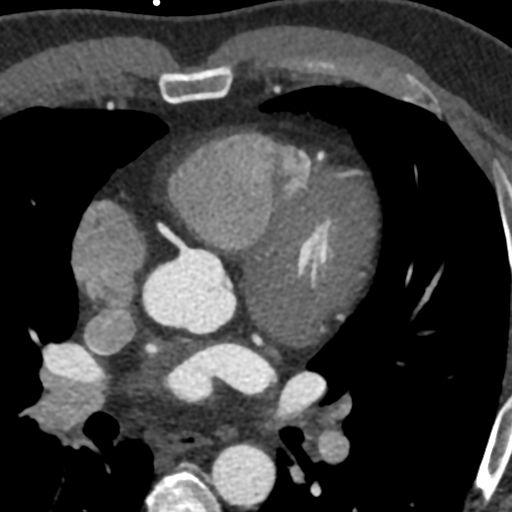

[Series 7: best syst 33 % · axial · 0.39mm/px · z∈[+1328,+1366]mm · 2 of 283 slices shown]
[im 95/283  vessel]
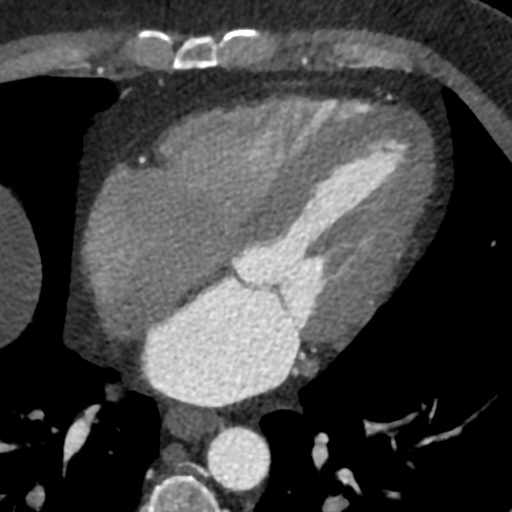
[im 189/283  vessel]
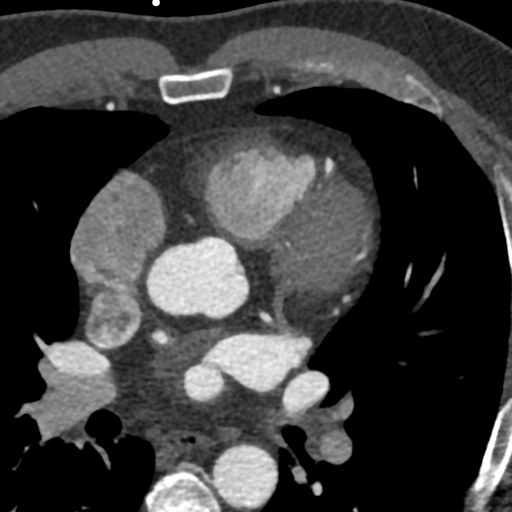

[Series 8: ts diast sharp 72 % · axial · 0.39mm/px · z∈[+1328,+1366]mm · 2 of 283 slices shown]
[im 95/283  lung]
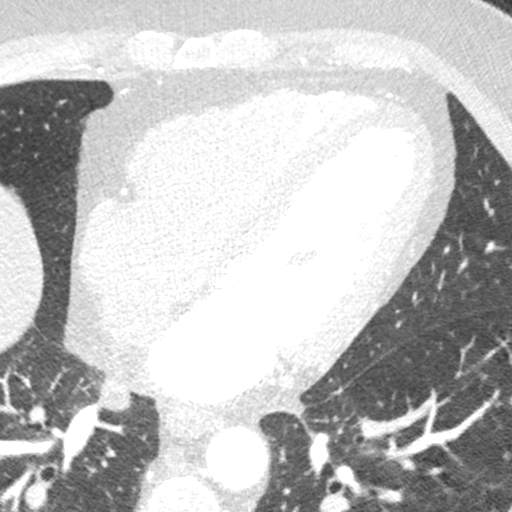
[im 189/283  lung]
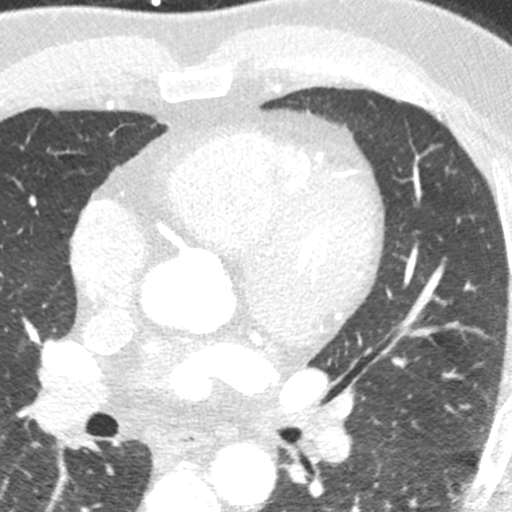

[Series 9: ts syst sharp 33 % · axial · 0.39mm/px · z∈[+1328,+1366]mm · 2 of 283 slices shown]
[im 95/283  lung]
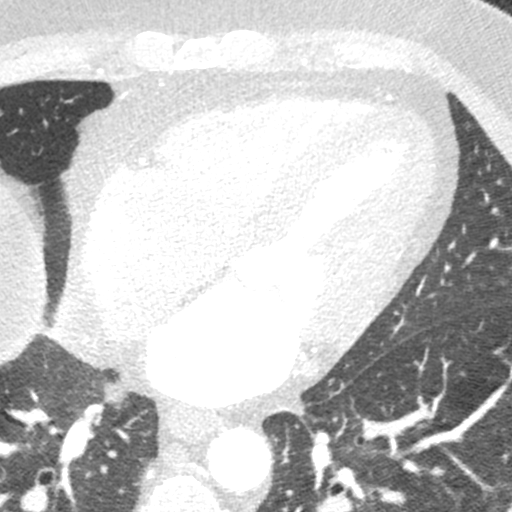
[im 189/283  lung]
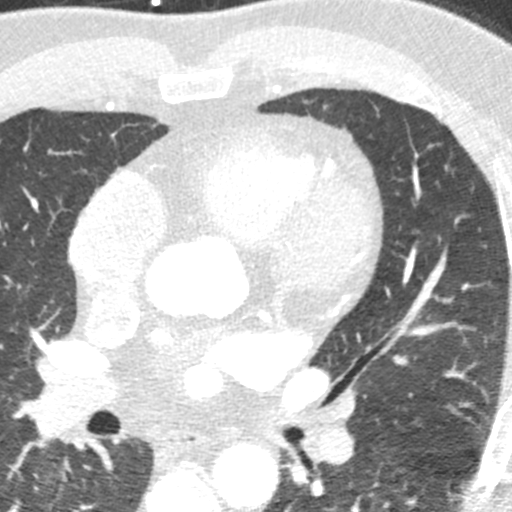

[8 of 20 positions shown; findings below may reference images not displayed]

FINDINGS: Aortic atherosclerosis. 3 mm pulmonary nodule in the left upper lobe
(axial image 6 of series 12), stable compared to prior study from
06/01/2009, considered definitively benign. Within the visualized
portions of the thorax there are no other suspicious appearing
pulmonary nodules or masses, there is no acute consolidative
airspace disease, no pleural effusions, no pneumothorax and no
lymphadenopathy. Visualized portions of the upper abdomen
demonstrates mild diffuse low attenuation throughout the visualized
portions of the hepatic parenchyma, indicative of hepatic steatosis.
There are no aggressive appearing lytic or blastic lesions noted in
the visualized portions of the skeleton.
IMPRESSION: 1.  Aortic Atherosclerosis (70KB0-A5M.M).
2. Hepatic steatosis.
FINDINGS: Non-cardiac: See separate report from [REDACTED]. No
significant findings on limited lung and soft tissue windows.

Calcium score: Calcium noted in ostial/proximal LAD and proximal RCA

Coronary Arteries: Right dominant with no anomalies

LM: Normal

LAD: 1-24% calcified plaque in ostium and proximally

IM: Large branching vessel normal

D1: Normal

Circumflex: Normal

OM1: Normal

AV groove: Normal

RCA: 1-24% calcified plaque just after the first RV branch

PDA: Normal

PLA: Normal
IMPRESSION: 1. CAD RADS 1 non obstructive CAD in proximal RCA/LAD see
description above

2.  Calcium score 71 which is 87th percentile for age/sex

3.  Normal aortic root 3.2 cm

Jacques Frantz Toietmoi

*** End of Addendum ***
EXAM:
OVER-READ INTERPRETATION  CT CHEST

The following report is an over-read performed by radiologist Dr.
Brendon Jim [REDACTED] on 11/02/2020. This
over-read does not include interpretation of cardiac or coronary
anatomy or pathology. The coronary calcium score/coronary CTA
interpretation by the cardiologist is attached.
FINDINGS: Aortic atherosclerosis. 3 mm pulmonary nodule in the left upper lobe
(axial image 6 of series 12), stable compared to prior study from
06/01/2009, considered definitively benign. Within the visualized
portions of the thorax there are no other suspicious appearing
pulmonary nodules or masses, there is no acute consolidative
airspace disease, no pleural effusions, no pneumothorax and no
lymphadenopathy. Visualized portions of the upper abdomen
demonstrates mild diffuse low attenuation throughout the visualized
portions of the hepatic parenchyma, indicative of hepatic steatosis.
There are no aggressive appearing lytic or blastic lesions noted in
the visualized portions of the skeleton.
IMPRESSION: 1.  Aortic Atherosclerosis (70KB0-A5M.M).
2. Hepatic steatosis.

## 2022-10-30 ENCOUNTER — Other Ambulatory Visit (HOSPITAL_COMMUNITY): Payer: Self-pay | Admitting: Adult Health Nurse Practitioner

## 2022-10-30 DIAGNOSIS — R29898 Other symptoms and signs involving the musculoskeletal system: Secondary | ICD-10-CM

## 2022-11-01 ENCOUNTER — Ambulatory Visit (HOSPITAL_COMMUNITY)
Admission: RE | Admit: 2022-11-01 | Discharge: 2022-11-01 | Disposition: A | Discharge status: Home / Self Care | Payer: Commercial Managed Care - PPO | Source: Ambulatory Visit | Attending: Adult Health Nurse Practitioner | Admitting: Adult Health Nurse Practitioner

## 2022-11-01 DIAGNOSIS — R29898 Other symptoms and signs involving the musculoskeletal system: Secondary | ICD-10-CM | POA: Diagnosis present

## 2023-02-01 NOTE — Progress Notes (Deleted)
CARDIOLOGY CONSULT NOTE       Patient ID: Alexander Osborn MRN: [id number] DOB/AGE: 1971/08/02 52 y.o.  Referring Physician: [facility]/ [facility]  Primary Physician: Benita Stabile, MD Primary Cardiologist: Eden Emms Reason for Consultation: Chest pain    HPI:  52 y.o. referred by [facility] and Dr Margo Aye for chest pain on 10/18/20  .Seen in ER 10/06/20 History of HTN and obesity. Had heaviness/pressure in chest at rest Associated with nausea No radiation He does have history of GERD and  SSCP had some GI overtones with nausea No relief of pain with TUMS  CXR showed normal mediastinum and NAD. Labs normal including Troponin x 2 ECG was normal SR rate 91 no acute changes   Lots of stess. [name] in [place] department 20/7 yo daughters  His older daughter is at [facility] and he use to be married to our nurse Tommy. 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 Had a headache a week after increase but not thought to be From ARB  Discussed weight loss and exercise He does try to get on peleton 3x/week  ***     ROS All other systems reviewed and negative except as noted above  Past Medical History:  Diagnosis Date   Allergy    ampicillin   Anxiety    Depression    GERD (gastroesophageal reflux disease)    Hypertension    Kidney stones 02/26/2017    Family History  Problem Relation Age of Onset   Stroke Mother    Heart disease Maternal Grandmother    Alzheimer's disease Maternal Grandfather    Alcohol abuse Paternal Grandfather    COPD Paternal Grandfather     Social History   Socioeconomic History   Marital status: Married    Spouse name:  Paige   Number of children: 2   Years of education: 14   Highest education level: Not on file  Occupational History   Occupation: Archivist, Economist, Patent examiner    Comment: [employer]  Tobacco Use   Smoking status: Former    Types: Cigarettes    Start date: 09/04/1992    Quit date: 09/04/2000    Years since quitting: 22.4   Smokeless tobacco: Current    Types: Snuff  Vaping Use   Vaping Use: Never used  Substance and Sexual Activity   Alcohol use: Yes    Comment: socially   Drug use: No   Sexual activity: Yes    Birth control/protection: None  Other Topics Concern   Not on file  Social History Narrative   Married   Two daughters   Patent examiner   Social Determinants of Health   Financial Resource Strain: Not on file  Food Insecurity: Not on file  Transportation Needs: Not on file  Physical Activity: Not on file  Stress: Not on file  Social Connections: Not on file  Intimate Partner Violence: Not on file    Past Surgical History:  Procedure Laterality Date   CERVICAL FUSION     KNEE ARTHROSCOPY W/ ACL RECONSTRUCTION     right   LITHOTRIPSY  thumb surgery        Current Outpatient Medications:    ASCORBIC ACID PO, Take 1 tablet by mouth daily., Disp: , Rfl:    aspirin EC 81 MG tablet, Take 1 tablet (81 mg total) by mouth daily. Swallow whole., Disp: 90 tablet, Rfl: 3   calcium carbonate (TUMS - DOSED IN MG ELEMENTAL CALCIUM) 500 MG chewable tablet, Chew 2 tablets by mouth daily., Disp: , Rfl:    ibuprofen (ADVIL,MOTRIN) 200 MG tablet, Take 200 mg by mouth every 6 (six) hours as needed. pain, Disp: , Rfl:    losartan (COZAAR) 50 MG tablet, Take 1 tablet (50 mg total) by mouth daily., Disp: 90 tablet, Rfl: 3   Multiple Vitamins-Minerals (ZINC PO), Take 1 tablet by mouth daily., Disp: , Rfl:    rosuvastatin (CRESTOR) 5 MG tablet, TAKE 1 TABLET(5 MG) BY MOUTH DAILY. MAKE APPOINTMENT WITH PROVIDER FOR FURTHER REFILLS, Disp: 15 tablet, Rfl: 0    testosterone cypionate (DEPOTESTOSTERONE CYPIONATE) 200 MG/ML injection, Inject 200 mg into the muscle every 14 (fourteen) days., Disp: , Rfl:     Physical Exam: There were no vitals taken for this visit.   Affect appropriate Healthy:  appears stated age HEENT: normal Neck supple with no adenopathy JVP normal no bruits no thyromegaly Lungs clear with no wheezing and good diaphragmatic motion Heart:  S1/S2 no murmur, no rub, gallop or click PMI normal Abdomen: benighn, BS positve, no tenderness, no AAA no bruit.  No HSM or HJR Distal pulses intact with no bruits No edema Neuro non-focal Skin warm and dry No muscular weakness   Labs:   Lab Results  Component Value Date   WBC 5.5 10/06/2020   HGB 15.7 10/06/2020   HCT 47.7 10/06/2020   MCV 90.0 10/06/2020   PLT 178 10/06/2020    No results for input(s): "NA", "K", "CL", "CO2", "BUN", "CREATININE", "CALCIUM", "PROT", "BILITOT", "ALKPHOS", "ALT", "AST", "GLUCOSE" in the last 168 hours.  Invalid input(s): "LABALBU" No results found for: "CKTOTAL", "CKMB", "CKMBINDEX", "TROPONINI"  Lab Results  Component Value Date   CHOL 172 11/04/2020   Lab Results  Component Value Date   HDL 46 11/04/2020   Lab Results  Component Value Date   LDLCALC 115 (H) 11/04/2020   Lab Results  Component Value Date   TRIG 56 11/04/2020   Lab Results  Component Value Date   CHOLHDL 3.7 11/04/2020   No results found for: "LDLDIRECT"    Radiology: No results found.   EKG: NSR rate 91 normal    ASSESSMENT AND PLAN:  1. Chest Pain: atypical being non exertional and normal ECG R/O in ER  Cardiac CTA March 2022 1-24% LAD/RCA disease medical Rx   2. BP: Better with Cozaar to 50 mg daily Low sodium diet Weight loss White coat component   3. GERD:  Low carb diet suggested primary give him protonix and not just using TUMS   4. HLD:  started on crestor 11/04/20 labs followed by primary    F/U in  a year   Signed: Charlton Haws 02/01/2023, 12:01 PM

## 2023-02-05 ENCOUNTER — Ambulatory Visit: Payer: Commercial Managed Care - PPO | Admitting: Cardiovascular Disease

## 2023-03-19 ENCOUNTER — Telehealth: Payer: Self-pay | Admitting: Internal Medicine

## 2023-03-19 NOTE — Telephone Encounter (Signed)
 FYI.  °Contacted patient regarding recall appointment, patient notified our office they did not wish to keep this appointment at this time.  Deleted recall from system. °

## 2024-05-06 NOTE — Progress Notes (Deleted)
 CARDIOLOGY CONSULT NOTE       Patient ID: Alexander Osborn MRN: [id number] DOB/AGE: [birth year] 53 y.o.  Referring Physician: [facility]  Primary Physician: Alexander Norleen PEDLAR, MD Primary Cardiologist: New Reason for Consultation: Chest pain    HPI:  53 y.o. last seen in 2022. Seen in [facility]  for chest pain on 10/18/20   History of HTN and obesity. Had heaviness/pressure in chest at rest Associated with nausea No radiation He does have history of GERD and  SSCP had some GI overtones with nausea No relief of pain with TUMS  CXR showed normal mediastinum and NAD. Labs normal including Troponin x 2 ECG was normal SR rate 91 no acute changes   Lots of stess. Ivonne in [place] department 53/9 yo daughters  His older daughter is at [facility] and he use to be married to our nurse Tommy. 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 Had a headache a week after increase but not thought to be From ARB   Discussed weight loss and exercise He does try to get on peleton 3x/week Recent complaints of palpitations Referred by Charmaine Heller [facility] after office visit 12/14/23 BP at that appointment 158/90 mmHg Given zanax and prozac for his anxiety  ***     ROS All other systems reviewed and negative except as noted above  Past Medical History:  Diagnosis Date  . Allergy    ampicillin  . Anxiety   . Depression   . GERD (gastroesophageal reflux disease)   . Hypertension   . Kidney stones 02/26/2017    Family History  Problem Relation Age of Onset  . Stroke Mother   . Heart disease Maternal Grandmother   . Alzheimer's disease Maternal  Grandfather   . Alcohol abuse Paternal Grandfather   . COPD Paternal Grandfather     Social History   Socioeconomic History  . Marital status: Married    Spouse name: Carlyon  . Number of children: 2  . Years of education: 22  . Highest education level: Not on file  Occupational History  . Occupation: Archivist, Economist, Patent examiner    Comment: [employer]  Tobacco Use  . Smoking status: Former    Current packs/day: 0.00    Types: Cigarettes    Start date: 09/04/1992    Quit date: 09/04/2000    Years since quitting: 23.6  . Smokeless tobacco: Current    Types: Snuff  Vaping Use  . Vaping status: Never Used  Substance and Sexual Activity  . Alcohol use: Yes    Comment: socially  . Drug use: No  . Sexual activity: Yes    Birth control/protection: None  Other Topics Concern  . Not on file  Social History Narrative   Married   Two daughters   Patent examiner   Social Drivers of Health   Financial Resource Strain: Low Risk  (09/25/2023)   Received from [facility]   Overall Financial Resource Strain (CARDIA)   . Difficulty of Paying Living Expenses: Not hard at all  Food Insecurity: No Food Insecurity (09/25/2023)   Received from [facility]   Hunger Vital Sign   . Worried About Programme researcher, broadcasting/film/video in the Last Year: Never true   . Ran Out of Food in the Last Year: Never true  Transportation Needs: No Transportation Needs (09/25/2023)   Received from [facility]   . Lack of Transportation (Medical): No   . Lack of Transportation (Non-Medical): No  Physical Activity: Sufficiently Active (09/25/2023)   Received from [facility]   Exercise Vital Sign   . Days of Exercise per Week: 3 days   . Minutes of Exercise per Session: 60 min  Stress: Stress Concern Present (09/25/2023)   Received from [facility] - Occupational Stress Questionnaire   . Feeling of Stress : To some extent   Social Connections: Socially Integrated (09/25/2023)   Received from [facility]   Social Network   . How would you rate your social network (family, work, friends)?: Good participation with social networks  Intimate Partner Violence: Not At Risk (09/25/2023)   Received from [facility]   HITS   . Over the last 12 months how often did your partner physically hurt you?: Never   . Over the last 12 months how often did your partner insult you or talk down to you?: Never   . Over the last 12 months how often did your partner threaten you with physical harm?: Never   . Over the last 12 months how often did your partner scream or curse at you?: Never    Past Surgical History:  Procedure Laterality Date  . CERVICAL FUSION    . KNEE ARTHROSCOPY W/ ACL RECONSTRUCTION     right  . LITHOTRIPSY    . thumb surgery        Current Outpatient Medications:  .  ASCORBIC ACID PO, Take 1 tablet by mouth daily., Disp: , Rfl:  .  aspirin  EC 81 MG tablet, Take 1 tablet (81 mg total) by mouth daily. Swallow whole., Disp: 90 tablet, Rfl: 3 .  calcium  carbonate (TUMS - DOSED IN MG ELEMENTAL CALCIUM ) 500 MG chewable tablet, Chew 2 tablets by mouth daily., Disp: , Rfl:  .  ibuprofen (ADVIL,MOTRIN) 200 MG tablet, Take 200 mg by mouth every 6 (six) hours as needed. pain, Disp: , Rfl:  .  losartan  (COZAAR ) 50 MG tablet, Take 1 tablet (50 mg total) by mouth daily., Disp: 90 tablet, Rfl: 3 .  Multiple Vitamins-Minerals (ZINC PO), Take 1 tablet by mouth daily., Disp: , Rfl:  .  rosuvastatin  (CRESTOR ) 5 MG tablet, TAKE 1 TABLET(5 MG) BY MOUTH DAILY. MAKE APPOINTMENT WITH PROVIDER FOR FURTHER REFILLS, Disp: 15 tablet, Rfl: 0 .  testosterone cypionate (DEPOTESTOSTERONE CYPIONATE) 200 MG/ML injection, Inject 200 mg into the muscle every 14 (fourteen) days., Disp: , Rfl:     Physical Exam: There were no vitals taken for this visit.   Affect appropriate Healthy:  appears stated age HEENT: normal Neck supple with  no adenopathy JVP normal no bruits no thyromegaly Lungs clear with no wheezing and good diaphragmatic motion Heart:  S1/S2 no murmur, no rub, gallop or click PMI normal Abdomen: benighn, BS positve, no tenderness, no AAA no bruit.  No HSM or HJR Distal pulses intact with no bruits No edema Neuro non-focal Skin warm and dry No muscular weakness   Labs:   Lab Results  Component Value Date   WBC 5.5 10/06/2020   HGB 15.7 10/06/2020   HCT 47.7  10/06/2020   MCV 90.0 10/06/2020   PLT 178 10/06/2020    No results for input(s): NA, K, CL, CO2, BUN, CREATININE, CALCIUM , PROT, BILITOT, ALKPHOS, ALT, AST, GLUCOSE in the last 168 hours.  Invalid input(s): LABALBU No results found for: CKTOTAL, CKMB, CKMBINDEX, TROPONINI  Lab Results  Component Value Date   CHOL 172 11/04/2020   Lab Results  Component Value Date   HDL 46 11/04/2020   Lab Results  Component Value Date   LDLCALC 115 (H) 11/04/2020   Lab Results  Component Value Date   TRIG 56 11/04/2020   Lab Results  Component Value Date   CHOLHDL 3.7 11/04/2020   No results found for: LDLDIRECT    Radiology: No results found.   EKG: NSR rate 91 normal    ASSESSMENT AND PLAN:  1. Chest Pain: atypical being non exertional and normal ECG R/O in ER  Cardiac CTA March 2022 1-24% LAD/RCA disease medical Rx   2. BP: Better with Cozaar  to 50 mg daily Low sodium diet Weight loss White coat component   3. GERD:  Low carb diet suggested primary give him protonix and not just using TUMS   4. HLD:  started on crestor  11/04/20 labs with primary Last LDL noted 98 09/26/23 Calcium  scor 71, 87 th percentile for age/sex 11/02/20 will update   5. Palpitations: doubt significant cardiac arrhythmia ? Related to stress and anxiety with subsequent elevated BP ***  Calcium  score  ***   F/U in  6 months   Signed: Maude Emmer 05/06/2024, 12:08 PM

## 2024-05-15 ENCOUNTER — Ambulatory Visit: Admitting: Cardiovascular Disease

## 2024-06-18 NOTE — Progress Notes (Signed)
 Subjective   Patient ID:  Alexander Osborn is a 53 y.o. (DOB 08/01/1971) male    Patient presents with  . Elbow Injury     HPI  Alexander Osborn is here today with acute onsets swelling to his left elbow.  He does have a scab there from where it was rubbing.  The area is very tender.  He denies injury.  He has had bursitis before and had to have it drained this feels different is more tender it is warm to touch.  He also was recently bit by a dog, that is healed well 2 small puncture wounds to his leg.  The dog was up-to-date on the shots.   Reviewed and updated this visit by provider: None        Review of Systems  Constitutional:  Positive for activity change.  Musculoskeletal:  Positive for joint swelling.     Objective   Vitals:   06/18/24 0913  BP: (!) 168/105  Patient Position: Sitting  Pulse: 86  Temp: 98.5 F (36.9 C)  TempSrc: Temporal  Resp: 18  Height: 5' 11 (1.803 m)  Weight: 200 lb (90.7 kg)  SpO2: 98%  BMI (Calculated): 27.9  PainSc:   1  PainLoc: Elbow     Physical Exam Constitutional:      Appearance: Normal appearance.  HENT:     Head: Normocephalic.  Musculoskeletal:     Left elbow: Swelling present.       Arms:     Comments: Warmth tenderness with palpation along bursa sack  Pulmonary:     Effort: Pulmonary effort is normal.  Neurological:     Mental Status: He is alert and oriented to person, place, and time.  Psychiatric:        Mood and Affect: Mood normal.        Behavior: Behavior normal.        Thought Content: Thought content normal.        Judgment: Judgment normal.       Assessment and Plan  1. Olecranon bursitis of left elbow (Primary) -     cephALEXin (KEFLEX) 500 mg capsule; Take one capsule (500 mg dose) by mouth 2 (two) times daily for 10 days., Starting Wed 06/18/2024, Until Sat 06/28/2024, Normal -     predniSONE (DELTASONE) 20 mg tablet; Take two tablet for 7 days, Normal    Attempted to drain area with  little benefit, suspect possible infection with scab.  No follow-ups on file.       Patient's Medications  New Prescriptions   CEPHALEXIN (KEFLEX) 500 MG CAPSULE    Take one capsule (500 mg dose) by mouth 2 (two) times daily for 10 days.   PREDNISONE (DELTASONE) 20 MG TABLET    Take two tablet for 7 days  Previous Medications   ALPRAZOLAM (XANAX) 0.25 MG TABLET    Take one tablet (0.25 mg dose) by mouth at bedtime as needed for Anxiety or Sleep. Max Daily Amount: 0.25 mg   ASPIRIN  (ECOTRIN LOW DOSE) EC TABLET    Take one tablet (81 mg dose) by mouth daily.   CALCIUM  CARBONATE (TUMS) 500 MG CHEWABLE TABLET    Chew two tablets (1,000 mg dose) by mouth daily.   IBUPROFEN (ADVIL,MOTRIN) 200 MG TABLET    Take one tablet (200 mg dose) by mouth every 6 (six) hours as needed.   LOSARTAN  POTASSIUM (COZAAR ) 25 MG TABLET    Take one tablet (25 mg dose) by mouth daily.  MULTIPLE MINERALS-VITAMINS PO    Take 1 tablet by mouth daily.   TESTOSTERONE CYPIONATE (DEPOTESTOTERONE CYPIONATE) 100 MG/ML INJECTION    Inject 1 mL (100 mg dose) into the muscle every 14 (fourteen) days. Max Daily Amount: 100 mg   TIRZEPATIDE (MOUNJARO) 7.5 MG/0.5 ML SOAJ INJECTION    Inject 0.5 mLs (7.5 mg dose) into the skin once a week.  Modified Medications   No medications on file  Discontinued Medications   FLUOXETINE (PROZAC) 10 MG CAPSULE    Take one capsule (10 mg dose) by mouth daily.      Risks, benefits, and alternatives of the medications and treatment plan prescribed today were discussed, and patient expressed understanding. Plan follow-up as discussed or as needed if any worsening symptoms or change in condition.   A yearly preventative health exam was recommended and current age based recommendations were discussed.  I have reviewed the information contained in this note and personally verified its accuracy.  MDM billing - I personally developed the plan of care based on documented medical decision making. Charmaine Heller, NP

## 2024-07-13 NOTE — Progress Notes (Deleted)
 CARDIOLOGY CONSULT NOTE       Patient ID: Alexander Osborn MRN: [id number] DOB/AGE: 1970/12/24 53 y.o.  Referring Physician: [facility]/ [facility]  Primary Physician: Shona Norleen PEDLAR, MD Primary Cardiologist: New Last seen by me 2022  Reason for Consultation: Chest pain    HPI:  53 y.o. referred by [facility] and Dr Shona for chest pain on 10/18/20  .Seen in ER 10/06/20 History of HTN and obesity. Had heaviness/pressure in chest at rest Associated with nausea No radiation He does have history of GERD and  SSCP had some GI overtones with nausea No relief of pain with TUMS  CXR showed normal mediastinum and NAD. Labs normal including Troponin x 2 ECG was normal SR rate 91 no acute changes   Lots of stess. Ivonne in [place] department 22/9 yo daughters  His older daughter is at [facility] and he use to be married to our nurse Tommy. 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 Had a headache a week after increase but not thought to be From ARB  Seen by primary 07/13/24 for left olecranon bursitis. Rx with Keflex and prednisone   ***     ROS All other systems reviewed and negative except as noted above  Past Medical History:  Diagnosis Date   Allergy    ampicillin   Anxiety    Depression    GERD (gastroesophageal reflux disease)    Hypertension    Kidney stones 02/26/2017    Family History  Problem Relation Age of Onset   Stroke Mother    Heart disease Maternal Grandmother    Alzheimer's disease Maternal Grandfather    Alcohol abuse Paternal Grandfather     COPD Paternal Grandfather     Social History   Socioeconomic History   Marital status: Married    Spouse name: Paige   Number of children: 2   Years of education: 14   Highest education level: Not on file  Occupational History   Occupation: Archivist, ECONOMIST, patent examiner    Comment: [employer]  Tobacco Use   Smoking status: Former    Current packs/day: 0.00    Types: Cigarettes    Start date: 09/04/1992    Quit date: 09/04/2000    Years since quitting: 23.8   Smokeless tobacco: Current    Types: Snuff  Vaping Use   Vaping status: Never Used  Substance and Sexual Activity   Alcohol use: Yes    Comment: socially   Drug use: No   Sexual activity: Yes    Birth control/protection: None  Other Topics Concern   Not on file  Social History Narrative   Married   Two daughters   Patent examiner   Social Drivers of Health   Financial Resource Strain: Low Risk  (09/25/2023)   Received from [facility]   Overall Financial Resource Strain (CARDIA)    Difficulty of Paying Living Expenses: Not hard at all  Food Insecurity: No Food Insecurity (09/25/2023)   Received  from [facility]   Hunger Vital Sign    Within the past 12 months, you worried that your food would run out before you got the money to buy more.: Never true    Within the past 12 months, the food you bought just didn't last and you didn't have money to get more.: Never true  Transportation Needs: No Transportation Needs (09/25/2023)   Received from [facility] - Transportation    Lack of Transportation (Medical): No    Lack of Transportation (Non-Medical): No  Physical Activity: Sufficiently Active (09/25/2023)   Received from [facility]   Exercise Vital Sign    On average, how many days per week do you engage in moderate to strenuous exercise (like a brisk walk)?: 3 days    On average, how many minutes do you engage in exercise at this level?: 60 min  Stress: Stress Concern  Present (09/25/2023)   Received from [facility] - Occupational Stress Questionnaire    Feeling of Stress : To some extent  Social Connections: Socially Integrated (09/25/2023)   Received from [facility]   Social Network    How would you rate your social network (family, work, friends)?: Good participation with social networks  Intimate Partner Violence: Not At Risk (09/25/2023)   Received from [facility]   HITS    Over the last 12 months how often did your partner physically hurt you?: Never    Over the last 12 months how often did your partner insult you or talk down to you?: Never    Over the last 12 months how often did your partner threaten you with physical harm?: Never    Over the last 12 months how often did your partner scream or curse at you?: Never    Past Surgical History:  Procedure Laterality Date   CERVICAL FUSION     KNEE ARTHROSCOPY W/ ACL RECONSTRUCTION     right   LITHOTRIPSY     thumb surgery        Current Outpatient Medications:    ASCORBIC ACID PO, Take 1 tablet by mouth daily., Disp: , Rfl:    aspirin  EC 81 MG tablet, Take 1 tablet (81 mg total) by mouth daily. Swallow whole., Disp: 90 tablet, Rfl: 3   calcium  carbonate (TUMS - DOSED IN MG ELEMENTAL CALCIUM ) 500 MG chewable tablet, Chew 2 tablets by mouth daily., Disp: , Rfl:    ibuprofen (ADVIL,MOTRIN) 200 MG tablet, Take 200 mg by mouth every 6 (six) hours as needed. pain, Disp: , Rfl:    losartan  (COZAAR ) 50 MG tablet, Take 1 tablet (50 mg total) by mouth daily., Disp: 90 tablet, Rfl: 3   Multiple Vitamins-Minerals (ZINC PO), Take 1 tablet by mouth daily., Disp: , Rfl:    rosuvastatin  (CRESTOR ) 5 MG tablet, TAKE 1 TABLET(5 MG) BY MOUTH DAILY. MAKE APPOINTMENT WITH PROVIDER FOR FURTHER REFILLS, Disp: 15 tablet, Rfl: 0   testosterone cypionate (DEPOTESTOSTERONE CYPIONATE) 200 MG/ML injection, Inject 200 mg into the muscle every 14 (fourteen) days., Disp: , Rfl:      Physical Exam: There were no vitals taken for this visit.   Affect appropriate Healthy:  appears stated age HEENT: normal Neck supple with no adenopathy JVP normal no bruits no thyromegaly Lungs clear with no wheezing and good diaphragmatic motion Heart:  S1/S2 no murmur, no rub, gallop or click PMI normal Abdomen: benighn, BS positve, no tenderness, no AAA no bruit.  No HSM or HJR Distal pulses intact with no bruits No edema Neuro non-focal Skin warm and dry No muscular weakness   Labs:   Lab Results  Component Value Date   WBC 5.5 10/06/2020   HGB 15.7 10/06/2020   HCT 47.7 10/06/2020   MCV 90.0 10/06/2020   PLT 178 10/06/2020    No results for input(s): NA, K, CL, CO2, BUN, CREATININE, CALCIUM , PROT, BILITOT, ALKPHOS, ALT, AST, GLUCOSE in the last 168 hours.  Invalid input(s): LABALBU No results found for: CKTOTAL, CKMB, CKMBINDEX, TROPONINI  Lab Results  Component Value Date   CHOL 172 11/04/2020   Lab Results  Component Value Date   HDL 46 11/04/2020   Lab Results  Component Value Date   LDLCALC 115 (H) 11/04/2020   Lab Results  Component Value Date   TRIG 56 11/04/2020   Lab Results  Component Value Date   CHOLHDL 3.7 11/04/2020   No results found for: LDLDIRECT    Radiology: No results found.   EKG: NSR rate 91 normal    ASSESSMENT AND PLAN:  1. Chest Pain: atypical being non exertional and normal ECG R/O in ER  Cardiac CTA March 2022 1-24% LAD/RCA disease medical Rx   2. BP: Better with Cozaar  to 50 mg daily Low sodium diet Weight loss White coat component   3. GERD:  Low carb diet suggested primary give him protonix and not just using TUMS   4. HLD:  started on crestor  11/04/20 LDL 115 update labs  5.  DM-2 on Mounjaro A1c good  6. Testosterone:  Rx needs BMET/Hct  Lipid/Liver Hct BMET    F/U in  6 months   Signed: Maude Emmer 07/13/2024, 5:04 PM

## 2024-07-16 ENCOUNTER — Ambulatory Visit: Admitting: Cardiovascular Disease

## 2024-11-28 ENCOUNTER — Ambulatory Visit: Admitting: Cardiovascular Disease
